# Patient Record
Sex: Male | Born: 1955
Health system: Southern US, Community
[De-identification: ages and names within clinical notes are randomized; demographics above are authoritative.]

## PROBLEM LIST (undated history)

## (undated) DIAGNOSIS — B009 Herpesviral infection, unspecified: Secondary | ICD-10-CM

## (undated) DIAGNOSIS — R7303 Prediabetes: Secondary | ICD-10-CM

## (undated) DIAGNOSIS — D369 Benign neoplasm, unspecified site: Secondary | ICD-10-CM

## (undated) DIAGNOSIS — K5730 Diverticulosis of large intestine without perforation or abscess without bleeding: Secondary | ICD-10-CM

## (undated) DIAGNOSIS — E7800 Pure hypercholesterolemia, unspecified: Secondary | ICD-10-CM

## (undated) DIAGNOSIS — K649 Unspecified hemorrhoids: Secondary | ICD-10-CM

## (undated) DIAGNOSIS — K760 Fatty (change of) liver, not elsewhere classified: Secondary | ICD-10-CM

## (undated) DIAGNOSIS — Z87442 Personal history of urinary calculi: Secondary | ICD-10-CM

## (undated) DIAGNOSIS — I10 Essential (primary) hypertension: Secondary | ICD-10-CM

## (undated) HISTORY — PX: SHOULDER SURGERY: SHX246

## (undated) HISTORY — DX: Unspecified hemorrhoids: K64.9

## (undated) HISTORY — PX: HAND SURGERY: SHX662

## (undated) HISTORY — DX: Pure hypercholesterolemia, unspecified: E78.00

## (undated) HISTORY — DX: Fatty (change of) liver, not elsewhere classified: K76.0

## (undated) HISTORY — DX: Benign neoplasm, unspecified site: D36.9

## (undated) HISTORY — PX: TONSILLECTOMY: SUR1361

## (undated) HISTORY — DX: Essential (primary) hypertension: I10

## (undated) HISTORY — DX: Diverticulosis of large intestine without perforation or abscess without bleeding: K57.30

---

## 2003-01-08 ENCOUNTER — Ambulatory Visit (HOSPITAL_COMMUNITY): Admission: RE | Admit: 2003-01-08 | Discharge: 2003-01-08 | Payer: Self-pay | Admitting: Family Medicine

## 2003-01-08 ENCOUNTER — Encounter: Payer: Self-pay | Admitting: Family Medicine

## 2004-04-07 ENCOUNTER — Ambulatory Visit (HOSPITAL_COMMUNITY): Admission: RE | Admit: 2004-04-07 | Discharge: 2004-04-07 | Payer: Self-pay | Admitting: Family Medicine

## 2004-06-03 ENCOUNTER — Emergency Department (HOSPITAL_COMMUNITY): Admission: EM | Admit: 2004-06-03 | Discharge: 2004-06-03 | Payer: Self-pay | Admitting: Emergency Medicine

## 2004-07-20 ENCOUNTER — Observation Stay (HOSPITAL_COMMUNITY): Admission: RE | Admit: 2004-07-20 | Discharge: 2004-07-21 | Payer: Self-pay | Admitting: Orthopedic Surgery

## 2006-04-24 ENCOUNTER — Ambulatory Visit (HOSPITAL_COMMUNITY): Admission: RE | Admit: 2006-04-24 | Discharge: 2006-04-24 | Payer: Self-pay | Admitting: Family Medicine

## 2006-05-29 ENCOUNTER — Ambulatory Visit (HOSPITAL_BASED_OUTPATIENT_CLINIC_OR_DEPARTMENT_OTHER): Admission: RE | Admit: 2006-05-29 | Discharge: 2006-05-30 | Payer: Self-pay | Admitting: Orthopedic Surgery

## 2007-01-10 ENCOUNTER — Ambulatory Visit (HOSPITAL_BASED_OUTPATIENT_CLINIC_OR_DEPARTMENT_OTHER): Admission: RE | Admit: 2007-01-10 | Discharge: 2007-01-11 | Payer: Self-pay | Admitting: Orthopedic Surgery

## 2007-09-04 ENCOUNTER — Ambulatory Visit: Payer: Self-pay | Admitting: Internal Medicine

## 2007-09-10 ENCOUNTER — Ambulatory Visit (HOSPITAL_COMMUNITY): Admission: RE | Admit: 2007-09-10 | Discharge: 2007-09-10 | Payer: Self-pay | Admitting: Internal Medicine

## 2007-09-10 ENCOUNTER — Ambulatory Visit: Payer: Self-pay | Admitting: Internal Medicine

## 2007-09-10 ENCOUNTER — Encounter: Payer: Self-pay | Admitting: Internal Medicine

## 2007-09-10 HISTORY — PX: COLONOSCOPY: SHX174

## 2007-09-13 ENCOUNTER — Ambulatory Visit (HOSPITAL_COMMUNITY): Admission: RE | Admit: 2007-09-13 | Discharge: 2007-09-13 | Payer: Self-pay | Admitting: Internal Medicine

## 2007-10-10 ENCOUNTER — Ambulatory Visit: Payer: Self-pay | Admitting: Internal Medicine

## 2010-09-04 ENCOUNTER — Encounter: Payer: Self-pay | Admitting: Internal Medicine

## 2010-12-27 NOTE — Consult Note (Signed)
[name] Alvarado, [name]                ACCOUNT NO.:  [id number]   MEDICAL RECORD NO.:  [id number]          PATIENT TYPE:  AMB   LOCATION:  DAY                           FACILITY:  [facility]   PHYSICIAN:  [name] Alvarado, M.D. DATE OF BIRTH:  [birth year]   DATE OF CONSULTATION:  DATE OF DISCHARGE:                                 CONSULTATION   GASTROENTEROLOGY CONSULTATION   REASON FOR CONSULTATION:  Abdominal pain, hematochezia.   PHYSICIAN REQUESTING CONSULTATION:  [name] Duel, MD.   PHYSICIAN COSIGNING NOTEJonathon Bellows, MD.   HISTORY OF PRESENT ILLNESS:  Patient is a 55 year old, Caucasian  gentleman, who presents for further evaluation of a several month  history of intermittent left lower quadrant abdominal pain and  hematochezia.  Off and on for several months, he has had fecal urgency.  He has had intermittent left lower quadrant abdominal pain, which lasts  minutes at a time.  He has seen a trace amount of bright red blood per  rectum.  Generally his stools are very regular and usually has  postprandial stools two or three times a day.  Denies any melena.  Denies any nausea or vomiting, heartburn, dysphagia, or odynophagia.  No  weight loss.  He has actually gained about 40 pounds in the last year or  two.  He states he has been under a lot of stress.  His girlfriend was  diagnosed with pancreatic cancer about eight or nine months ago at the  age of 46.  She is receiving palliative care only.  He has never  underwent a colonoscopy.  Denies any family history of colorectal cancer  or liver disease or chronic GI illnesses.   CURRENT MEDICATIONS:  1. Lisinopril 10 mg daily.  2. Flexeril p.Iann.  3. Omega-3 and Fish Oil daily.   ALLERGIES:  No known drug allergies.   PAST MEDICAL HISTORY:  1. Hypertension.  2. Hypercholesterolemia, he does not take any statins because he does      not like the risk for potential side effects.  3. History of sinusitis and headaches.  4.  He has multiple scars on his neck related to prior injuries.  5. Bilateral shoulder repair, twice on the right in the last three      years.  6. Left hand surgery.  7. Tonsillectomy.   FAMILY HISTORY:  Negative for GI illnesses, colorectal cancer, or liver  disease.   SOCIAL HISTORY:  He is divorced.  He has two children, a son and a  daughter.  His girlfriend is age 48 and has terminal pancreatic  carcinoma.  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  He has  never been a smoker.  Denies any regular alcohol use.  As a teenager, he  would drink on the weekends only.   REVIEW OF SYSTEMS:  See HPI for GI and CONSTITUTIONAL.  CARDIOPULMONARY:  He denies any chest pain or shortness of breath.  GENITOURINARY:  No  dysuria or hematuria.   PHYSICAL EXAMINATION:  VITAL  SIGNS:  Weight 225, height 5 foot 11, temp  97.6, blood pressure 142/96, pulse 68.  GENERAL:  A pleasant, well-nourished, well-developed, Caucasian  gentleman in no acute distress.  SKIN:  Warm and dry, no jaundice.  HEENT:  Sclerae are nonicteric, oropharyngeal mucosa moist and pink, no  lesions, erythema, or exudate.  NECK:  No lymphadenopathy or thyromegaly.  CHEST:  Lungs are clear to auscultation.  CARDIAC:  Regular rate and rhythm, normal S1 S2, no murmurs, rubs, or  gallops.  ABDOMEN:  Positive bowel sounds, abdomen is soft and nondistended, he  has mild left lower quadrant tenderness to deep palpation, no rebound  tenderness or guarding, no abdominal bruits, no hernias.  Liver edge  palpable 4 to 5 cm below the right costal margin in the midclavicular  line.  LOWER EXTREMITIES:  No edema.   IMPRESSION:  [name] Alvarado is a 55 year old gentleman with intermittent left  lower quadrant abdominal pain and intermittent hematochezia.  He has  never had a colonoscopy.  We will recommend colonoscopy for further  evaluation.  Differential  includes bleeding from internal hemorrhoids,  polyps, colorectal cancer, less likely inflammatory bowel disease.  He  also has a history of mildly elevated transaminases possibly in the  setting of statins for hypercholesterolemia.  Recent labs, however, on  August 29, 2007, revealed normal liver function tests.   PLAN:  1. Colonoscopy in the near future with [name] Alvarado.  2. If colonoscopy does not explain his abdominal pain, I would      consider further evaluation at that time.  3. Further recommendations to follow.   I would like to thank [name] Alvarado for allowing [place] to take part in the  care of this patient.      [name] Alvarado, P.AJonathon Alvarado, M.D.  Electronically Signed    [name]  D:  09/04/2007  T:  09/04/2007  Job:  [id number]   cc:   [name] Alvarado, M.D.  Fax: [phone]

## 2010-12-27 NOTE — Assessment & Plan Note (Signed)
NAMEBLISS, TSANG                 CHART#:  [id number]   DATE:  10/10/2007                       DOB:  Nov 14, 1955   CHIEF COMPLAINT:  Follow up of procedure.   SUBJECTIVE:  Mr. Almquist is here for a follow up. He was recently seen at  the time of colonoscopy on 09/10/2007 to further evaluate intermittent  left lower quadrant abdominal pain and interim intense hematochezia. He  had friable internal and anal hemorrhoids. He had a sigmoid diverticula  and diminutive polyp at 30 centimeters. The polyp was a tubular adenoma.  He is due for surveillance colonoscopy in January 2014. He also had a CT  to further evaluate his abdominal pain. This revealed fatty liver. There  was no obvious cause for his abdominal pain. He also had normal LFTs and  CBC. He states that overall he is feeling better. He is having only  vague left lower quadrant abdominal pain at times now. He denies any  change in appetite. There is no heartburn. Bowel movements are regular.  He has had no further bleeding.   CURRENT MEDICATIONS:  See the updated list.   ALLERGIES:  No known drug allergies.   PHYSICAL EXAMINATION:  VITAL SIGNS:  Weight 222, temperature 98, blood  pressure 130/88, pulse 88.  GENERAL:  Pleasant, well developed, well nourished Caucasian gentleman  in no acute distress.  SKIN:  Warm and dry. No jaundice.  HEENT:  Sclerae nonicteric, oral pharyngeal mucosa moist and pink.  ABDOMEN:  Positive bowel sounds. Soft and nontender. Nondistended. No  organomegaly or masses. No rebound or guarding. No abdominal bruits or  hernia.  LOWER EXTREMITIES:  No edema.   IMPRESSION:  Mr. Sidman is a pleasant 55 year old gentleman with vague  left lower quadrant abdominal pain and intermittent hematochezia.  Colonoscopy revealed hemorrhoids and a tubular adenoma which was  removed. CT, I felt did not reveal any cause of his abdominal pain. He  had fatty liver in the setting of normal LFTs. Overall, he is feeling  better. He admits that he has been under quite a bit of stress with his  girlfriend who has terminal pancreatic cancer. He states that his pain  has improved and is very minimal at this point. I offered anti-spasmodic  therapy with a trial. However, he declines.   PLAN:  1. He will be due for colonoscopy given his history of tubular adenoma      in January 2014.  2. Office visit p.r.n.       [name], P.A.  Electronically Signed     R. Roetta Sessions, M.D.  Electronically Signed    [name]  D:  10/10/2007  T:  10/11/2007  Job:  [id number]   cc:   Patrica Duel, M.D.

## 2010-12-27 NOTE — Op Note (Signed)
[name] Alvarado, [name] Alvarado                ACCOUNT NO.:  [id number]   MEDICAL RECORD NO.:  [id number]          PATIENT TYPE:  AMB   LOCATION:  DAY                           FACILITY:  [facility]   PHYSICIAN:  R. Roetta Sessions, M.D. DATE OF BIRTH:  08-Apr-1956   DATE OF PROCEDURE:  09/10/2007  DATE OF DISCHARGE:                               OPERATIVE REPORT   PROCEDURE:  Colonoscopy with biopsy.   INDICATIONS FOR PROCEDURE:  A 55 year old gentleman with intermittent  left lower quadrant abdominal pain intermittent with hematochezia.  He  has never had his lower GI tract imaged, and there is no family history  of colorectal neoplasia or inflammatory bowel disease.  Colonoscopy is  now being done.  This procedure was discussed with the patient at  length, the potential risks, benefits, and alternatives have been  reviewed, and questions answered.  He is agreeable.  Please see the  documentation in the medical record.   PROCEDURE NOTE:  O2 saturation, blood pressure, pulse, and respirations  were monitored throughout the entire procedure.  Conscious sedation with  Versed 4 mg IV and Demerol 100 mg IV in divided doses.   INSTRUMENT:  Pentax video chip system.   FINDINGS:  Digital rectal exam revealed no abnormalities.  Endoscopic  findings:  The prep was adequate.  The colon and colonic mucosa were  surveyed from the rectosigmoid junction to the left transverse and right  colon to the appendiceal orifices, ileocecal valve, and cecum where  these structures were well-seen and photographed for the record.  Following this, the colonoscope was slowly withdrawn and all previous  mucosal surfaces were again seen.  The patient was noted to have the  following abnormalities:  1)  Scattered sigmoid diverticula; 2)  Diminutive polyp at 30 cm, cold biopsied/removed.  The scope was pulled  down into the rectum.  Upon further examination of the rectal mucosa  including a retroflexed view of the anal verge and  retroflexed view of  the anal canal, there were some generally some friable internal  hemorrhoids.  The patient tolerated the procedure well and was reacted  in Endoscopy.   IMPRESSION:  1. Friable hemorrhoids (internal and anal canal), otherwise, normal      rectum.  2. Sigmoid diverticula and diminutive polyp at 30 cm status post cold      biopsy and removal.  The remainder of the colonic mucosa appeared      normal.   RECOMMENDATIONS:  Follow up on path.  Diverticulosis literature provided  to [name] Alvarado.  I suspect he bled from hemorrhoids, but it does not  explain his abdominal pain.  Will proceed with an abdominopelvic CT scan  in the very near future and make further recommendations soon.      [name] Alvarado, M.D.  Electronically Signed     RMR/MEDQ  D:  09/10/2007  T:  09/10/2007  Job:  [id number]   cc:   Patrica Duel, M.D.  Fax: [phone]

## 2012-07-24 ENCOUNTER — Encounter: Payer: Self-pay | Admitting: *Deleted

## 2012-08-27 ENCOUNTER — Telehealth: Payer: Self-pay | Admitting: *Deleted

## 2012-08-27 NOTE — Telephone Encounter (Signed)
I attempted to call [name] Alvarado today to speak with him about his colonoscopy which is due this month, however there was no answer.

## 2013-04-30 ENCOUNTER — Other Ambulatory Visit: Payer: Self-pay | Admitting: Internal Medicine

## 2013-04-30 ENCOUNTER — Ambulatory Visit (INDEPENDENT_AMBULATORY_CARE_PROVIDER_SITE_OTHER): Payer: 59 | Admitting: Internal Medicine

## 2013-04-30 ENCOUNTER — Encounter: Payer: Self-pay | Admitting: Internal Medicine

## 2013-04-30 VITALS — BP 149/89 | HR 69 | Temp 97.2°F | Ht 71.0 in | Wt 216.4 lb

## 2013-04-30 DIAGNOSIS — Z860100 Personal history of colon polyps, unspecified: Secondary | ICD-10-CM | POA: Insufficient documentation

## 2013-04-30 DIAGNOSIS — Z1211 Encounter for screening for malignant neoplasm of colon: Secondary | ICD-10-CM

## 2013-04-30 MED ORDER — PEG-KCL-NACL-NASULF-NA ASC-C 100 G PO SOLR: 1.0000 | ORAL | Status: DC

## 2013-04-30 NOTE — Progress Notes (Signed)
Primary Care Physician:  Kirk Ruths, MD Primary Gastroenterologist:  Dr. Jena Gauss  Pre-Procedure History & Physical: HPI:  [name] Alvarado is a 57 y.o. male here for here for consideration of surveillance colonoscopy. In early 2009, patient had a couple of adenomatous polyps removed his colon. He has done well. No rectal bleeding or abdominal pain. In fact, no GI symptoms. He is due for a surveillance examination at this time.  Past Medical History  Diagnosis Date  . HTN (hypertension)   . Hypercholesterolemia   . Tubular adenoma   . Diverticula of colon   . Hemorrhoids   . Fatty liver     Past Surgical History  Procedure Laterality Date  . Shoulder surgery      bilat  . Hand surgery      L  . Tonsillectomy    . Colonoscopy  09/10/2007    Dr. Jena Gauss- friable himorrhoids (internal and anal canal) o/w normal rectum,sigmoid diverticula, bx= tubular adenoma    Prior to Admission medications   Medication Sig Start Date End Date Taking? Authorizing Provider  lisinopril (PRINIVIL,ZESTRIL) 20 MG tablet Take 20 mg by mouth daily.  04/07/13  Yes Historical Provider, MD  peg 3350 powder (MOVIPREP) 100 G SOLR Take 1 kit (200 g total) by mouth as directed. 04/30/13   Corbin Ade, MD    Allergies as of 04/30/2013  . (Not on File)    No family history on file.  History   Social History  . Marital Status: Married    Spouse Name: N/A    Number of Children: N/A  . Years of Education: N/A   Occupational History  . Not on file.   Social History Main Topics  . Smoking status: Never Smoker   . Smokeless tobacco: Not on file  . Alcohol Use: No  . Drug Use: No  . Sexual Activity: Not on file   Other Topics Concern  . Not on file   Social History Narrative  . No narrative on file    Review of Systems: See HPI, otherwise negative ROS  Physical Exam: BP 149/89  Pulse 69  Temp(Src) 97.2 F (36.2 C) (Oral)  Ht 5\' 11"  (1.803 m)  Wt 216 lb 6.4 oz (98.158 kg)  BMI 30.19  kg/m2 General:   Alert,  Well-developed, well-nourished, pleasant and cooperative in NAD Skin:  Intact without significant lesions or rashes. Eyes:  Sclera clear, no icterus.   Conjunctiva pink. Ears:  Normal auditory acuity. Nose:  No deformity, discharge,  or lesions. Mouth:  No deformity or lesions. Neck:  Supple; no masses or thyromegaly. No significant cervical adenopathy. Lungs:  Clear throughout to auscultation.   No wheezes, crackles, or rhonchi. No acute distress. Heart:  Regular rate and rhythm; no murmurs, clicks, rubs,  or gallops. Abdomen: Non-distended, normal bowel sounds.  Soft and nontender without appreciable mass or hepatosplenomegaly.  Pulses:  Normal pulses noted. Extremities:  Without clubbing or edema.  Impression/Plan:  Pleasant 58 year old gentleman who had 2 adenomatous polyps removed from his colon about 5 and half years ago. Surveillance colonoscopy offered to this nice gentleman at this time. The risks, benefits, limitations, alternatives and imponderables have been reviewed with the patient. Questions have been answered. All parties are agreeable.

## 2013-04-30 NOTE — Patient Instructions (Signed)
Schedule surveillance colonoscopy (history of colonic adenomas)  Use split prep

## 2013-05-09 ENCOUNTER — Encounter (HOSPITAL_COMMUNITY): Payer: Self-pay | Admitting: Pharmacy Technician

## 2013-05-16 ENCOUNTER — Encounter (HOSPITAL_COMMUNITY)
Admission: RE | Disposition: A | Discharge status: Home / Self Care | Payer: Self-pay | Source: Ambulatory Visit | Attending: Internal Medicine

## 2013-05-16 ENCOUNTER — Ambulatory Visit (HOSPITAL_COMMUNITY)
Admission: RE | Admit: 2013-05-16 | Discharge: 2013-05-16 | Disposition: A | Discharge status: Home / Self Care | Payer: 59 | Source: Ambulatory Visit | Attending: Internal Medicine | Admitting: Internal Medicine

## 2013-05-16 ENCOUNTER — Encounter (HOSPITAL_COMMUNITY): Payer: Self-pay

## 2013-05-16 DIAGNOSIS — Z860100 Personal history of colon polyps, unspecified: Secondary | ICD-10-CM | POA: Insufficient documentation

## 2013-05-16 DIAGNOSIS — Z1211 Encounter for screening for malignant neoplasm of colon: Secondary | ICD-10-CM

## 2013-05-16 DIAGNOSIS — K5730 Diverticulosis of large intestine without perforation or abscess without bleeding: Secondary | ICD-10-CM

## 2013-05-16 DIAGNOSIS — D175 Benign lipomatous neoplasm of intra-abdominal organs: Secondary | ICD-10-CM

## 2013-05-16 DIAGNOSIS — I10 Essential (primary) hypertension: Secondary | ICD-10-CM | POA: Insufficient documentation

## 2013-05-16 DIAGNOSIS — Z09 Encounter for follow-up examination after completed treatment for conditions other than malignant neoplasm: Secondary | ICD-10-CM | POA: Insufficient documentation

## 2013-05-16 DIAGNOSIS — D126 Benign neoplasm of colon, unspecified: Secondary | ICD-10-CM | POA: Insufficient documentation

## 2013-05-16 HISTORY — PX: COLONOSCOPY: SHX5424

## 2013-05-16 SURGERY — COLONOSCOPY
Anesthesia: Moderate Sedation

## 2013-05-16 MED ORDER — SODIUM CHLORIDE 0.9 % IV SOLN
INTRAVENOUS | Status: DC
  Administered 2013-05-16: 07:00:00 via INTRAVENOUS

## 2013-05-16 MED ORDER — MEPERIDINE HCL 100 MG/ML IJ SOLN
INTRAMUSCULAR | Status: AC
  Filled 2013-05-16: qty 2

## 2013-05-16 MED ORDER — MEPERIDINE HCL 25 MG/ML IJ SOLN
INTRAMUSCULAR | Status: DC | PRN
  Administered 2013-05-16: 25 mg via INTRAVENOUS

## 2013-05-16 MED ORDER — ONDANSETRON HCL 4 MG/2ML IJ SOLN
INTRAMUSCULAR | Status: AC
  Filled 2013-05-16: qty 2

## 2013-05-16 MED ORDER — MEPERIDINE HCL 100 MG/ML IJ SOLN
INTRAMUSCULAR | Status: DC | PRN
  Administered 2013-05-16 (×2): 50 mg via INTRAVENOUS

## 2013-05-16 MED ORDER — MIDAZOLAM HCL 5 MG/5ML IJ SOLN
INTRAMUSCULAR | Status: AC
  Filled 2013-05-16: qty 10

## 2013-05-16 MED ORDER — MIDAZOLAM HCL 5 MG/5ML IJ SOLN
INTRAMUSCULAR | Status: DC | PRN
  Administered 2013-05-16: 1 mg via INTRAVENOUS

## 2013-05-16 MED ORDER — STERILE WATER FOR IRRIGATION IR SOLN
Status: DC | PRN
  Administered 2013-05-16: 07:00:00

## 2013-05-16 MED ORDER — MIDAZOLAM HCL 5 MG/5ML IJ SOLN
INTRAMUSCULAR | Status: DC | PRN
  Administered 2013-05-16 (×2): 2 mg via INTRAVENOUS

## 2013-05-16 NOTE — Op Note (Signed)
[facility] [address] [place], [address]   COLONOSCOPY PROCEDURE REPORT  PATIENT: [name], Alvarado  MR#:         [id number] BIRTHDATE: 27-Mar-1956 , 57  yrs. old GENDER: Male ENDOSCOPIST: R.  Roetta Sessions, MD FACP FACG REFERRED BY:  Karleen Hampshire, M.D. PROCEDURE DATE:  05/16/2013 PROCEDURE:     Ileocolonoscopy with snare polypectomy and biopsy  INDICATIONS: history of colonic adenoma  INFORMED CONSENT:  The risks, benefits, alternatives and imponderables including but not limited to bleeding, perforation as well as the possibility of a missed lesion have been reviewed.  The potential for biopsy, lesion removal, etc. have also been discussed.  Questions have been answered.  All parties agreeable. Please see the history and physical in the medical record for more information.  MEDICATIONS: Versed 5 mg IV and Demerol 125 mg IV in divided doses. Zofran 4 mg IV.  DESCRIPTION OF PROCEDURE:  After a digital rectal exam was performed, the EC-[id number] ([id number])  colonoscope was advanced from the anus through the rectum and colon to the area of the cecum, ileocecal valve and appendiceal orifice.  The cecum was deeply intubated.  These structures were well-seen and photographed for the record.  From the level of the cecum and ileocecal valve, the scope was slowly and cautiously withdrawn.  The mucosal surfaces were carefully surveyed utilizing scope tip deflection to facilitate fold flattening as needed.  The scope was pulled down into the rectum where a thorough examination including retroflexion was performed.    FINDINGS:  Adequate preparation. Normal rectum. Scattered pancolonic diverticula. (1) mid sigmoid diminutive polyp. (1) 6 mm mid descending colon polyp. Otherwise, the remainder of the colonic mucosa appeared normal aside from a 6 mm yellowish submucosal nodule at the hepatic flexure (positive + pillow sign).  The distal 5 cm of terminal ileal mucosa  appeared normal.  THERAPEUTIC / DIAGNOSTIC MANEUVERS PERFORMED:  The above-mentioned polyps were cold biopsy and cold snare removed, respectively  COMPLICATIONS: None CECAL WITHDRAWAL TIME:  13 minutes  IMPRESSION:  Colonic polyps-removed as described above. Colonic diverticulosis. Colonic lipoma.  RECOMMENDATIONS: Followup on pathology report.   _______________________________ eSigned:  R. Roetta Sessions, MD FACP [facility] 05/16/2013 8:34 AM   CC:    PATIENT NAME:  [name], Alvarado MR#: [id number]

## 2013-05-16 NOTE — Interval H&P Note (Signed)
History and Physical Interval Note:  05/16/2013 7:36 AM  [name] Alvarado  has presented today for surgery, with the diagnosis of HISTORY OF COLONIC ADENOMA  The various methods of treatment have been discussed with the patient and family. After consideration of risks, benefits and other options for treatment, the patient has consented to  Procedure(s) with comments: COLONOSCOPY (N/A) - 7:30 as a surgical intervention .  The patient's history has been reviewed, patient examined, no change in status, stable for surgery.  I have reviewed the patient's chart and labs.  Questions were answered to the patient's satisfaction.     No change;  colonoscopy per plan.The risks, benefits, limitations, alternatives and imponderables have been reviewed with the patient. Questions have been answered. All parties are agreeable.   Eula Listen

## 2013-05-16 NOTE — H&P (View-Only) (Signed)
Primary Care Physician:  Kirk Ruths, MD Primary Gastroenterologist:  Dr. Jena Gauss  Pre-Procedure History & Physical: HPI:  [name] Alvarado is a 57 y.o. male here for here for consideration of surveillance colonoscopy. In early 2009, patient had a couple of adenomatous polyps removed his colon. He has done well. No rectal bleeding or abdominal pain. In fact, no GI symptoms. He is due for a surveillance examination at this time.  Past Medical History  Diagnosis Date  . HTN (hypertension)   . Hypercholesterolemia   . Tubular adenoma   . Diverticula of colon   . Hemorrhoids   . Fatty liver     Past Surgical History  Procedure Laterality Date  . Shoulder surgery      bilat  . Hand surgery      L  . Tonsillectomy    . Colonoscopy  09/10/2007    Dr. Jena Gauss- friable himorrhoids (internal and anal canal) o/w normal rectum,sigmoid diverticula, bx= tubular adenoma    Prior to Admission medications   Medication Sig Start Date End Date Taking? Authorizing Provider  lisinopril (PRINIVIL,ZESTRIL) 20 MG tablet Take 20 mg by mouth daily.  04/07/13  Yes Historical Provider, MD  peg 3350 powder (MOVIPREP) 100 G SOLR Take 1 kit (200 g total) by mouth as directed. 04/30/13   Corbin Ade, MD    Allergies as of 04/30/2013  . (Not on File)    No family history on file.  History   Social History  . Marital Status: Married    Spouse Name: N/A    Number of Children: N/A  . Years of Education: N/A   Occupational History  . Not on file.   Social History Main Topics  . Smoking status: Never Smoker   . Smokeless tobacco: Not on file  . Alcohol Use: No  . Drug Use: No  . Sexual Activity: Not on file   Other Topics Concern  . Not on file   Social History Narrative  . No narrative on file    Review of Systems: See HPI, otherwise negative ROS  Physical Exam: BP 149/89  Pulse 69  Temp(Src) 97.2 F (36.2 C) (Oral)  Ht 5\' 11"  (1.803 m)  Wt 216 lb 6.4 oz (98.158 kg)  BMI 30.19  kg/m2 General:   Alert,  Well-developed, well-nourished, pleasant and cooperative in NAD Skin:  Intact without significant lesions or rashes. Eyes:  Sclera clear, no icterus.   Conjunctiva pink. Ears:  Normal auditory acuity. Nose:  No deformity, discharge,  or lesions. Mouth:  No deformity or lesions. Neck:  Supple; no masses or thyromegaly. No significant cervical adenopathy. Lungs:  Clear throughout to auscultation.   No wheezes, crackles, or rhonchi. No acute distress. Heart:  Regular rate and rhythm; no murmurs, clicks, rubs,  or gallops. Abdomen: Non-distended, normal bowel sounds.  Soft and nontender without appreciable mass or hepatosplenomegaly.  Pulses:  Normal pulses noted. Extremities:  Without clubbing or edema.  Impression/Plan:  Pleasant 57 year old gentleman who had 2 adenomatous polyps removed from his colon about 5 and half years ago. Surveillance colonoscopy offered to this nice gentleman at this time. The risks, benefits, limitations, alternatives and imponderables have been reviewed with the patient. Questions have been answered. All parties are agreeable.

## 2013-05-19 ENCOUNTER — Encounter: Payer: Self-pay | Admitting: Internal Medicine

## 2013-05-20 ENCOUNTER — Encounter (HOSPITAL_COMMUNITY): Payer: Self-pay | Admitting: Internal Medicine

## 2015-10-18 ENCOUNTER — Emergency Department (HOSPITAL_COMMUNITY)
Admission: EM | Admit: 2015-10-18 | Discharge: 2015-10-18 | Disposition: A | Discharge status: Home / Self Care | Payer: Self-pay | Attending: Emergency Medicine | Admitting: Emergency Medicine

## 2015-10-18 ENCOUNTER — Encounter (HOSPITAL_COMMUNITY): Payer: Self-pay | Admitting: Emergency Medicine

## 2015-10-18 ENCOUNTER — Emergency Department (HOSPITAL_COMMUNITY): Payer: Self-pay

## 2015-10-18 DIAGNOSIS — J40 Bronchitis, not specified as acute or chronic: Secondary | ICD-10-CM | POA: Insufficient documentation

## 2015-10-18 DIAGNOSIS — E7800 Pure hypercholesterolemia, unspecified: Secondary | ICD-10-CM | POA: Insufficient documentation

## 2015-10-18 DIAGNOSIS — Z79899 Other long term (current) drug therapy: Secondary | ICD-10-CM | POA: Insufficient documentation

## 2015-10-18 DIAGNOSIS — I10 Essential (primary) hypertension: Secondary | ICD-10-CM | POA: Insufficient documentation

## 2015-10-18 DIAGNOSIS — R55 Syncope and collapse: Secondary | ICD-10-CM | POA: Insufficient documentation

## 2015-10-18 LAB — CBC WITH DIFFERENTIAL/PLATELET
Basophils Absolute: 0 10*3/uL (ref 0.0–0.1)
Basophils Relative: 0 %
Eosinophils Absolute: 0.1 10*3/uL (ref 0.0–0.7)
Eosinophils Relative: 2 %
HCT: 43.1 % (ref 39.0–52.0)
Hemoglobin: 14.2 g/dL (ref 13.0–17.0)
Lymphocytes Relative: 17 %
Lymphs Abs: 1.3 10*3/uL (ref 0.7–4.0)
MCH: 29.9 pg (ref 26.0–34.0)
MCHC: 32.9 g/dL (ref 30.0–36.0)
MCV: 90.7 fL (ref 78.0–100.0)
Monocytes Absolute: 0.5 10*3/uL (ref 0.1–1.0)
Monocytes Relative: 7 %
Neutro Abs: 5.5 10*3/uL (ref 1.7–7.7)
Neutrophils Relative %: 74 %
Platelets: 122 10*3/uL — ABNORMAL LOW (ref 150–400)
RBC: 4.75 MIL/uL (ref 4.22–5.81)
RDW: 13.2 % (ref 11.5–15.5)
WBC: 7.5 10*3/uL (ref 4.0–10.5)

## 2015-10-18 LAB — BASIC METABOLIC PANEL
Anion gap: 7 (ref 5–15)
BUN: 17 mg/dL (ref 6–20)
CO2: 27 mmol/L (ref 22–32)
Calcium: 8 mg/dL — ABNORMAL LOW (ref 8.9–10.3)
Chloride: 103 mmol/L (ref 101–111)
Creatinine, Ser: 0.93 mg/dL (ref 0.61–1.24)
GFR calc Af Amer: >60 mL/min (ref >60)
GFR calc non Af Amer: >60 mL/min (ref >60)
Glucose, Bld: 126 mg/dL — ABNORMAL HIGH (ref 65–99)
Potassium: 4.3 mmol/L (ref 3.5–5.1)
Sodium: 137 mmol/L (ref 135–145)

## 2015-10-18 MED ORDER — DEXAMETHASONE SODIUM PHOSPHATE 4 MG/ML IJ SOLN
12.0000 mg | Freq: Once | INTRAMUSCULAR | Status: AC
  Administered 2015-10-18: 12 mg via INTRAVENOUS
  Filled 2015-10-18: qty 3

## 2015-10-18 MED ORDER — SODIUM CHLORIDE 0.9 % IV BOLUS (SEPSIS)
1000.0000 mL | Freq: Once | INTRAVENOUS | Status: AC
  Administered 2015-10-18: 1000 mL via INTRAVENOUS

## 2015-10-18 NOTE — ED Notes (Signed)
Per EMS- pt reports feeling "lightheaded and weak" while working today and "waking up" in supervisor's office. Unsure of loc or fall-not reported by coworkers. Pt reports not feeling well x a few days. Denies n/v/d. EMS reports pt was alert and oriented upon their arrival, bradycardic and hypotensive. Pt given 500 cc fluid bolus in route. Nad noted.

## 2015-10-21 NOTE — ED Provider Notes (Signed)
CSN: [id number]     Arrival date & time 10/18/15  0726 History   First MD Initiated Contact with Patient 10/18/15 0732     Chief Complaint  Patient presents with  . Weakness     (Consider location/radiation/quality/duration/timing/severity/associated sxs/prior Treatment) HPI   59yM with syncope. Was at work at [employer] when began to feel nauseated and sweaty. He felt as if he may pass out and started walking over to his supervisor's office. Reportedly he did then pass out briefly. Brief loss of consciousness. Quick return to baseline. Currently feels much better On review of systems she reports that over the past several days she has not felt very well. Has been coughing and wheezing. Has felt tired/run down.He denies any fevers or chills. No acute pain. Unusual leg pain or swelling. No vomiting or diarrhea.Is a smoker. He was appropriately bradycardic and hypotensive while on scene. He received 500 mL normal saline prior to arrival. Arrived to the emergency room in no distress with a normal heart rate and blood pressure.  Past Medical History  Diagnosis Date  . HTN (hypertension)   . Hypercholesterolemia   . Tubular adenoma   . Diverticula of colon   . Hemorrhoids   . Fatty liver    Past Surgical History  Procedure Laterality Date  . Shoulder surgery      bilat  . Hand surgery      L  . Tonsillectomy    . Colonoscopy  09/10/2007    Dr. Gala Romney- friable himorrhoids (internal and anal canal) o/w normal rectum,sigmoid diverticula, bx= tubular adenoma  . Colonoscopy N/A 05/16/2013    Procedure: COLONOSCOPY;  Surgeon: Daneil Dolin, MD;  Location: [facility];  Service: Endoscopy;  Laterality: N/A;  7:30   No family history on file. Social History  Substance Use Topics  . Smoking status: Never Smoker   . Smokeless tobacco: None  . Alcohol Use: No    Review of Systems  All systems reviewed and negative, other than as noted in HPI.   Allergies  Review of patient's allergies  indicates no known allergies.  Home Medications   Prior to Admission medications   Medication Sig Start Date End Date Taking? Authorizing Provider  lisinopril (PRINIVIL,ZESTRIL) 20 MG tablet Take 20 mg by mouth daily.  04/07/13   Historical Provider, MD  loratadine (CLARITIN) 10 MG tablet Take 10 mg by mouth daily as needed for allergies.    Historical Provider, MD  peg 3350 powder (MOVIPREP) 100 G SOLR Take 1 kit (200 g total) by mouth as directed. 04/30/13   Daneil Dolin, MD   BP 135/74 mmHg  Pulse 75  Temp(Src) 97.9 F (36.6 C) (Oral)  Resp 14  Ht '5\' 11"'  (1.803 m)  Wt 206 lb (93.441 kg)  BMI 28.74 kg/m2  SpO2 98% Physical Exam  Constitutional: He appears well-developed and well-nourished. No distress.  HENT:  Head: Normocephalic and atraumatic.  Eyes: Conjunctivae are normal. Right eye exhibits no discharge. Left eye exhibits no discharge.  Neck: Neck supple.  Cardiovascular: Normal rate, regular rhythm and normal heart sounds.  Exam reveals no gallop and no friction rub.   No murmur heard. Pulmonary/Chest: Effort normal. No respiratory distress. He has wheezes.  Abdominal: Soft. He exhibits no distension. There is no tenderness.  Musculoskeletal: He exhibits no edema or tenderness.  Lower extremities symmetric as compared to each other. No calf tenderness. Negative Homan's. No palpable cords.   Neurological: He is alert.  Skin: Skin  is warm and dry.  Psychiatric: He has a normal mood and affect. His behavior is normal. Thought content normal.  Nursing note and vitals reviewed.   ED Course  Procedures (including critical care time) Labs Review Labs Reviewed  CBC WITH DIFFERENTIAL/PLATELET - Abnormal; Notable for the following:    Platelets 122 (*)    All other components within normal limits  BASIC METABOLIC PANEL - Abnormal; Notable for the following:    Glucose, Bld 126 (*)    Calcium 8.0 (*)    All other components within normal limits    Imaging Review No  results found. I have personally reviewed and evaluated these images and lab results as part of my medical decision-making.   EKG Interpretation   Date/Time:  Monday October 18 2015 07:26:54 EST Ventricular Rate:  63 PR Interval:  159 QRS Duration: 83 QT Interval:  404 QTC Calculation: 413 R Axis:   46 Text Interpretation:  Sinus rhythm Nonspecific T abnormalities, lateral  leads No old tracing to compare Confirmed by Yanci Bachtell  MD, [place] ([id number]) on  10/18/2015 7:42:12 AM      MDM   Final diagnoses:  Syncope and collapse  Bronchitis    60 year old male with syncopal episode today. Hemodynamically stable. Now feels much better. Recent URI symptoms. Diffuse wheezing on exam. Suspect viral bronchitis.    Virgel Manifold, MD 10/22/15 [phone]

## 2016-06-08 ENCOUNTER — Ambulatory Visit (INDEPENDENT_AMBULATORY_CARE_PROVIDER_SITE_OTHER): Payer: BLUE CROSS/BLUE SHIELD | Admitting: *Deleted

## 2016-06-08 DIAGNOSIS — Z23 Encounter for immunization: Secondary | ICD-10-CM | POA: Diagnosis not present

## 2016-07-10 DIAGNOSIS — E663 Overweight: Secondary | ICD-10-CM | POA: Diagnosis not present

## 2016-07-10 DIAGNOSIS — I10 Essential (primary) hypertension: Secondary | ICD-10-CM | POA: Diagnosis not present

## 2016-07-10 DIAGNOSIS — E669 Obesity, unspecified: Secondary | ICD-10-CM | POA: Diagnosis not present

## 2016-07-10 DIAGNOSIS — Z1389 Encounter for screening for other disorder: Secondary | ICD-10-CM | POA: Diagnosis not present

## 2016-07-10 DIAGNOSIS — Z6828 Body mass index (BMI) 28.0-28.9, adult: Secondary | ICD-10-CM | POA: Diagnosis not present

## 2016-09-20 DIAGNOSIS — Z6827 Body mass index (BMI) 27.0-27.9, adult: Secondary | ICD-10-CM | POA: Diagnosis not present

## 2016-09-20 DIAGNOSIS — B349 Viral infection, unspecified: Secondary | ICD-10-CM | POA: Diagnosis not present

## 2016-09-20 DIAGNOSIS — J019 Acute sinusitis, unspecified: Secondary | ICD-10-CM | POA: Diagnosis not present

## 2016-09-20 DIAGNOSIS — E663 Overweight: Secondary | ICD-10-CM | POA: Diagnosis not present

## 2016-09-20 DIAGNOSIS — Z1389 Encounter for screening for other disorder: Secondary | ICD-10-CM | POA: Diagnosis not present

## 2016-12-15 DIAGNOSIS — Z1389 Encounter for screening for other disorder: Secondary | ICD-10-CM | POA: Diagnosis not present

## 2016-12-15 DIAGNOSIS — W57XXXA Bitten or stung by nonvenomous insect and other nonvenomous arthropods, initial encounter: Secondary | ICD-10-CM | POA: Diagnosis not present

## 2016-12-15 DIAGNOSIS — I10 Essential (primary) hypertension: Secondary | ICD-10-CM | POA: Diagnosis not present

## 2016-12-15 DIAGNOSIS — Z6827 Body mass index (BMI) 27.0-27.9, adult: Secondary | ICD-10-CM | POA: Diagnosis not present

## 2016-12-15 DIAGNOSIS — E663 Overweight: Secondary | ICD-10-CM | POA: Diagnosis not present

## 2017-05-18 DIAGNOSIS — Z1389 Encounter for screening for other disorder: Secondary | ICD-10-CM | POA: Diagnosis not present

## 2017-05-18 DIAGNOSIS — Z6827 Body mass index (BMI) 27.0-27.9, adult: Secondary | ICD-10-CM | POA: Diagnosis not present

## 2017-05-18 DIAGNOSIS — I10 Essential (primary) hypertension: Secondary | ICD-10-CM | POA: Diagnosis not present

## 2017-05-18 DIAGNOSIS — E663 Overweight: Secondary | ICD-10-CM | POA: Diagnosis not present

## 2017-05-18 DIAGNOSIS — Z23 Encounter for immunization: Secondary | ICD-10-CM | POA: Diagnosis not present

## 2017-11-16 DIAGNOSIS — Z6826 Body mass index (BMI) 26.0-26.9, adult: Secondary | ICD-10-CM | POA: Diagnosis not present

## 2017-11-16 DIAGNOSIS — E663 Overweight: Secondary | ICD-10-CM | POA: Diagnosis not present

## 2017-11-16 DIAGNOSIS — R7309 Other abnormal glucose: Secondary | ICD-10-CM | POA: Diagnosis not present

## 2017-11-16 DIAGNOSIS — I10 Essential (primary) hypertension: Secondary | ICD-10-CM | POA: Diagnosis not present

## 2017-11-16 DIAGNOSIS — Z1389 Encounter for screening for other disorder: Secondary | ICD-10-CM | POA: Diagnosis not present

## 2017-11-16 DIAGNOSIS — M546 Pain in thoracic spine: Secondary | ICD-10-CM | POA: Diagnosis not present

## 2018-02-08 DIAGNOSIS — I10 Essential (primary) hypertension: Secondary | ICD-10-CM | POA: Diagnosis not present

## 2018-02-08 DIAGNOSIS — Z0001 Encounter for general adult medical examination with abnormal findings: Secondary | ICD-10-CM | POA: Diagnosis not present

## 2018-02-08 DIAGNOSIS — Z6825 Body mass index (BMI) 25.0-25.9, adult: Secondary | ICD-10-CM | POA: Diagnosis not present

## 2018-02-08 DIAGNOSIS — Z1389 Encounter for screening for other disorder: Secondary | ICD-10-CM | POA: Diagnosis not present

## 2018-02-18 DIAGNOSIS — E663 Overweight: Secondary | ICD-10-CM | POA: Diagnosis not present

## 2018-02-18 DIAGNOSIS — Z6825 Body mass index (BMI) 25.0-25.9, adult: Secondary | ICD-10-CM | POA: Diagnosis not present

## 2018-02-18 DIAGNOSIS — Z1389 Encounter for screening for other disorder: Secondary | ICD-10-CM | POA: Diagnosis not present

## 2018-02-21 ENCOUNTER — Encounter: Payer: Self-pay | Admitting: Internal Medicine

## 2018-03-06 DIAGNOSIS — E119 Type 2 diabetes mellitus without complications: Secondary | ICD-10-CM | POA: Diagnosis not present

## 2018-03-06 DIAGNOSIS — Z1389 Encounter for screening for other disorder: Secondary | ICD-10-CM | POA: Diagnosis not present

## 2018-03-06 DIAGNOSIS — E663 Overweight: Secondary | ICD-10-CM | POA: Diagnosis not present

## 2018-03-06 DIAGNOSIS — T50905A Adverse effect of unspecified drugs, medicaments and biological substances, initial encounter: Secondary | ICD-10-CM | POA: Diagnosis not present

## 2018-03-06 DIAGNOSIS — Z6827 Body mass index (BMI) 27.0-27.9, adult: Secondary | ICD-10-CM | POA: Diagnosis not present

## 2018-04-01 ENCOUNTER — Ambulatory Visit (INDEPENDENT_AMBULATORY_CARE_PROVIDER_SITE_OTHER): Payer: Self-pay

## 2018-04-01 DIAGNOSIS — Z86010 Personal history of colonic polyps: Secondary | ICD-10-CM

## 2018-04-01 MED ORDER — PEG 3350-KCL-NA BICARB-NACL 420 G PO SOLR: 4000.0000 mL | ORAL | 0 refills | Status: DC

## 2018-04-01 NOTE — Patient Instructions (Signed)
[name] Alvarado   Mar 21, 1956 MRN: [id number]    Procedure Date: 05/24/18 Time to register: 11:00 Place to register: [facility] Procedure Time: 12:00 Scheduled provider: R. Garfield Cornea, MD  PREPARATION FOR COLONOSCOPY WITH TRI-LYTE SPLIT PREP  Please notify us immediately if you are diabetic, take iron supplements, or if you are on Coumadin or any other blood thinners.    You will need to purchase 1 fleet enema and 1 box of Bisacodyl 13m tablets.   2 DAYS BEFORE PROCEDURE:  DATE: 05/22/18   DAY: Wednesday Begin clear liquid diet AFTER your lunch meal. NO SOLID FOODS after this point.  1 DAY BEFORE PROCEDURE:  DATE: 05/23/18   DAY: Thursday Continue clear liquids the entire day - NO SOLID FOOD.    At 2:00 pm:  Take 2 Bisacodyl tablets.   At 4:00pm:  Start drinking your solution. Make sure you mix well per instructions on the bottle. Try to drink 1 (one) 8 ounce glass every 10-15 minutes until you have consumed HALF the jug. You should complete by 6:00pm.You must keep the left over solution refrigerated until completed next day.  Continue clear liquids. You must drink plenty of clear liquids to prevent dehyration and kidney failure.     DAY OF PROCEDURE:   DATE: 05/24/18  DAY: Friday If you take medications for your heart, blood pressure or breathing, you may take these medications.    Five hours before your procedure time @ 7:00am:  Finish remaining amout of bowel prep, drinking 1 (one) 8 ounce glass every 10-15 minutes until complete. You have two hours to consume remaining prep.   Three hours before your procedure time _0 :00am:  Nothing by mouth.   At least one hour before going to the hospital:  Give yourself one Fleet enema. You may take your morning medications with sip of water unless we have instructed otherwise.      Please see below for Dietary Information.  CLEAR LIQUIDS INCLUDE:  Water Jello (NOT red in color)   Ice Popsicles (NOT red in color)    Tea (sugar ok, no milk/cream) Powdered fruit flavored drinks  Coffee (sugar ok, no milk/cream) Gatorade/ Lemonade/ Kool-Aid  (NOT red in color)   Juice: apple, white grape, white cranberry Soft drinks  Clear bullion, consomme, broth (fat free beef/chicken/vegetable)  Carbonated beverages (any kind)  Strained chicken noodle soup Hard Candy   Remember: Clear liquids are liquids that will allow you to see your fingers on the other side of a clear glass. Be sure liquids are NOT red in color, and not cloudy, but CLEAR.  DO NOT EAT OR DRINK ANY OF THE FOLLOWING:  Dairy products of any kind   Cranberry juice Tomato juice / V8 juice   Grapefruit juice Orange juice     Red grape juice  Do not eat any solid foods, including such foods as: cereal, oatmeal, yogurt, fruits, vegetables, creamed soups, eggs, bread, crackers, pureed foods in a blender, etc.   HELPFUL HINTS FOR DRINKING PREP SOLUTION:   Make sure prep is extremely cold. Mix and refrigerate the the morning of the prep. You may also put in the freezer.   You may try mixing some Crystal Light or Country Time Lemonade if you prefer. Mix in small amounts; add more if necessary.  Try drinking through a straw  Rinse mouth with water or a mouthwash between glasses, to remove after-taste.  Try sipping on a cold beverage /ice/ popsicles between glasses of prep.  Place a piece of sugar-free hard candy in mouth between glasses.  If you become nauseated, try consuming smaller amounts, or stretch out the time between glasses. Stop for 30-60 minutes, then slowly start back drinking.        OTHER INSTRUCTIONS  You will need a responsible adult at least 60 years of age to accompany you and drive you home. This person must remain in the waiting room during your procedure. The hospital will cancel your procedure if you do not have a responsible adult with you.   1. Wear loose fitting clothing that is easily removed. 2. Leave jewelry and  other valuables at home.  3. Remove all body piercing jewelry and leave at home. 4. Total time from sign-in until discharge is approximately 2-3 hours. 5. You should go home directly after your procedure and rest. You can resume normal activities the day after your procedure. 6. The day of your procedure you should not:  Drive  Make legal decisions  Operate machinery  Drink alcohol  Return to work   You may call the office (Dept: [phone]) before 5:00pm, or page the doctor on call [phone]) after 5:00pm, for further instructions, if necessary.   Insurance Information YOU WILL NEED TO CHECK WITH YOUR INSURANCE COMPANY FOR THE BENEFITS OF COVERAGE YOU HAVE FOR THIS PROCEDURE.  UNFORTUNATELY, NOT ALL INSURANCE COMPANIES HAVE BENEFITS TO COVER ALL OR PART OF THESE TYPES OF PROCEDURES.  IT IS YOUR RESPONSIBILITY TO CHECK YOUR BENEFITS, HOWEVER, WE WILL BE GLAD TO ASSIST YOU WITH ANY CODES YOUR INSURANCE COMPANY MAY NEED.    PLEASE NOTE THAT MOST INSURANCE COMPANIES WILL NOT COVER A SCREENING COLONOSCOPY FOR PEOPLE UNDER THE AGE OF 50  IF YOU HAVE BCBS INSURANCE, YOU MAY HAVE BENEFITS FOR A SCREENING COLONOSCOPY BUT IF POLYPS ARE FOUND THE DIAGNOSIS WILL CHANGE AND THEN YOU MAY HAVE A DEDUCTIBLE THAT WILL NEED TO BE MET. SO PLEASE MAKE SURE YOU CHECK YOUR BENEFITS FOR A SCREENING COLONOSCOPY AS WELL AS A DIAGNOSTIC COLONOSCOPY.

## 2018-04-01 NOTE — Progress Notes (Signed)
Gastroenterology Pre-Procedure Review  Request Date:04/01/18 Requesting Physician: 5 year recall RMR  PATIENT REVIEW QUESTIONS: The patient responded to the following health history questions as indicated:    1. Diabetes Melitis: no 2. Joint replacements in the past 12 months: no 3. Major health problems in the past 3 months: no 4. Has an artificial valve or MVP: no 5. Has a defibrillator: no 6. Has been advised in past to take antibiotics in advance of a procedure like teeth cleaning: no 7. Family history of colon cancer: no  8. Alcohol Use: no 9. History of sleep apnea: no  10. History of coronary artery or other vascular stents placed within the last 12 months: no 11. History of any prior anesthesia complications: no    MEDICATIONS & ALLERGIES:    Patient reports the following regarding taking any blood thinners:   Plavix? no Aspirin? yes (81mg ) Coumadin? no Brilinta? no Xarelto? no Eliquis? no Pradaxa? no Savaysa? no Effient? no  Patient confirms/reports the following medications:  Current Outpatient Medications  Medication Sig Dispense Refill  . aspirin EC 81 MG tablet Take 81 mg by mouth daily.    Marland Kitchen losartan-hydrochlorothiazide (HYZAAR) 100-25 MG tablet Take 1 tablet by mouth daily.  1   No current facility-administered medications for this visit.     Patient confirms/reports the following allergies:  No Known Allergies  No orders of the defined types were placed in this encounter.   AUTHORIZATION INFORMATION Primary Insurance: [place],  [place] #: [id number] Pre-Cert / Josem Kaufmann required: no   SCHEDULE INFORMATION: Procedure has been scheduled as follows:  Date: 05/24/18, Time: 12:00 Location: [facility] Dr.Rourk  This Gastroenterology Pre-Precedure Review Form is being routed to the following provider(s): Walden Field NP

## 2018-04-02 NOTE — Progress Notes (Signed)
Ok to schedule.

## 2018-05-24 ENCOUNTER — Ambulatory Visit (HOSPITAL_COMMUNITY)
Admission: RE | Admit: 2018-05-24 | Discharge: 2018-05-24 | Disposition: A | Discharge status: Home / Self Care | Payer: BLUE CROSS/BLUE SHIELD | Source: Ambulatory Visit | Attending: Internal Medicine | Admitting: Internal Medicine

## 2018-05-24 ENCOUNTER — Other Ambulatory Visit: Payer: Self-pay

## 2018-05-24 ENCOUNTER — Encounter (HOSPITAL_COMMUNITY)
Admission: RE | Disposition: A | Discharge status: Home / Self Care | Payer: Self-pay | Source: Ambulatory Visit | Attending: Internal Medicine

## 2018-05-24 ENCOUNTER — Encounter (HOSPITAL_COMMUNITY): Payer: Self-pay | Admitting: *Deleted

## 2018-05-24 DIAGNOSIS — I10 Essential (primary) hypertension: Secondary | ICD-10-CM | POA: Diagnosis not present

## 2018-05-24 DIAGNOSIS — K5730 Diverticulosis of large intestine without perforation or abscess without bleeding: Secondary | ICD-10-CM | POA: Insufficient documentation

## 2018-05-24 DIAGNOSIS — D175 Benign lipomatous neoplasm of intra-abdominal organs: Secondary | ICD-10-CM | POA: Diagnosis not present

## 2018-05-24 DIAGNOSIS — E7800 Pure hypercholesterolemia, unspecified: Secondary | ICD-10-CM | POA: Diagnosis not present

## 2018-05-24 DIAGNOSIS — Z1211 Encounter for screening for malignant neoplasm of colon: Secondary | ICD-10-CM | POA: Insufficient documentation

## 2018-05-24 DIAGNOSIS — K621 Rectal polyp: Secondary | ICD-10-CM | POA: Insufficient documentation

## 2018-05-24 DIAGNOSIS — Z79899 Other long term (current) drug therapy: Secondary | ICD-10-CM | POA: Diagnosis not present

## 2018-05-24 DIAGNOSIS — Z7982 Long term (current) use of aspirin: Secondary | ICD-10-CM | POA: Insufficient documentation

## 2018-05-24 DIAGNOSIS — Z86010 Personal history of colonic polyps: Secondary | ICD-10-CM | POA: Insufficient documentation

## 2018-05-24 HISTORY — PX: COLONOSCOPY: SHX5424

## 2018-05-24 HISTORY — PX: POLYPECTOMY: SHX5525

## 2018-05-24 SURGERY — COLONOSCOPY
Anesthesia: Moderate Sedation

## 2018-05-24 MED ORDER — SODIUM CHLORIDE 0.9 % IV SOLN
INTRAVENOUS | Status: DC
  Administered 2018-05-24: 11:00:00 via INTRAVENOUS

## 2018-05-24 MED ORDER — MEPERIDINE HCL 50 MG/ML IJ SOLN
INTRAMUSCULAR | Status: AC
  Filled 2018-05-24: qty 1

## 2018-05-24 MED ORDER — ONDANSETRON HCL 4 MG/2ML IJ SOLN
INTRAMUSCULAR | Status: AC
  Filled 2018-05-24: qty 2

## 2018-05-24 MED ORDER — ONDANSETRON HCL 4 MG/2ML IJ SOLN
INTRAMUSCULAR | Status: DC | PRN
  Administered 2018-05-24: 4 mg via INTRAVENOUS

## 2018-05-24 MED ORDER — MIDAZOLAM HCL 5 MG/5ML IJ SOLN
INTRAMUSCULAR | Status: DC | PRN
  Administered 2018-05-24: 2 mg via INTRAVENOUS
  Administered 2018-05-24 (×2): 1 mg via INTRAVENOUS

## 2018-05-24 MED ORDER — MIDAZOLAM HCL 5 MG/5ML IJ SOLN
INTRAMUSCULAR | Status: AC
  Filled 2018-05-24: qty 10

## 2018-05-24 MED ORDER — MEPERIDINE HCL 100 MG/ML IJ SOLN
INTRAMUSCULAR | Status: DC | PRN
  Administered 2018-05-24: 25 mg via INTRAVENOUS
  Administered 2018-05-24: 15 mg via INTRAVENOUS

## 2018-05-24 NOTE — Discharge Instructions (Signed)
°Colonoscopy °Discharge Instructions ° °Read the instructions outlined below and refer to this sheet in the next few weeks. These discharge instructions provide you with general information on caring for yourself after you leave the hospital. Your doctor may also give you specific instructions. While your treatment has been planned according to the most current medical practices available, unavoidable complications occasionally occur. If you have any problems or questions after discharge, call Dr. Rourk at [phone]. °ACTIVITY °· You may resume your regular activity, but move at a slower pace for the next 24 hours.  °· Take frequent rest periods for the next 24 hours.  °· Walking will help get rid of the air and reduce the bloated feeling in your belly (abdomen).  °· No driving for 24 hours (because of the medicine (anesthesia) used during the test).   °· Do not sign any important legal documents or operate any machinery for 24 hours (because of the anesthesia used during the test).  °NUTRITION °· Drink plenty of fluids.  °· You may resume your normal diet as instructed by your doctor.  °· Begin with a light meal and progress to your normal diet. Heavy or fried foods are harder to digest and may make you feel sick to your stomach (nauseated).  °· Avoid alcoholic beverages for 24 hours or as instructed.  °MEDICATIONS °· You may resume your normal medications unless your doctor tells you otherwise.  °WHAT YOU CAN EXPECT TODAY °· Some feelings of bloating in the abdomen.  °· Passage of more gas than usual.  °· Spotting of blood in your stool or on the toilet paper.  °IF YOU HAD POLYPS REMOVED DURING THE COLONOSCOPY: °· No aspirin products for 7 days or as instructed.  °· No alcohol for 7 days or as instructed.  °· Eat a soft diet for the next 24 hours.  °FINDING OUT THE RESULTS OF YOUR TEST °Not all test results are available during your visit. If your test results are not back during the visit, make an appointment  with your caregiver to find out the results. Do not assume everything is normal if you have not heard from your caregiver or the medical facility. It is important for you to follow up on all of your test results.  °SEEK IMMEDIATE MEDICAL ATTENTION IF: °· You have more than a spotting of blood in your stool.  °· Your belly is swollen (abdominal distention).  °· You are nauseated or vomiting.  °· You have a temperature over 101.  °· You have abdominal pain or discomfort that is severe or gets worse throughout the day.  ° ° ° °Colon polyp and diverticulosis information provided ° °Further recommendations to follow pending review of pathology report ° ° °Diverticulosis °Diverticulosis is a condition that develops when small pouches (diverticula) form in the wall of the large intestine (colon). The colon is where water is absorbed and stool is formed. The pouches form when the inside layer of the colon pushes through weak spots in the outer layers of the colon. You may have a few pouches or many of them. °What are the causes? °The cause of this condition is not known. °What increases the risk? °The following factors may make you more likely to develop this condition: °· Being older than age 60. Your risk for this condition increases with age. Diverticulosis is rare among people younger than age 30. By age 80, many people have it. °· Eating a low-fiber diet. °· Having frequent constipation. °· Being   Not getting enough exercise.  Smoking.  Taking over-the-counter pain medicines, like aspirin and ibuprofen.  Having a family history of diverticulosis.  What are the signs or symptoms? In most people, there are no symptoms of this condition. If you do have symptoms, they may include:  Bloating.  Cramps in the abdomen.  Constipation or diarrhea.  Pain in the lower left side of the abdomen.  How is this diagnosed? This condition is most often diagnosed during an exam for other colon problems.  Because diverticulosis usually has no symptoms, it often cannot be diagnosed independently. This condition may be diagnosed by:  Using a flexible scope to examine the colon (colonoscopy).  Taking an X-ray of the colon after dye has been put into the colon (barium enema).  Doing a CT scan.  How is this treated? You may not need treatment for this condition if you have never developed an infection related to diverticulosis. If you have had an infection before, treatment may include:  Eating a high-fiber diet. This may include eating more fruits, vegetables, and grains.  Taking a fiber supplement.  Taking a live bacteria supplement (probiotic).  Taking medicine to relax your colon.  Taking antibiotic medicines.  Follow these instructions at home:  Drink 6-8 glasses of water or more each day to prevent constipation.  Try not to strain when you have a bowel movement.  If you have had an infection before: ? Eat more fiber as directed by your health care provider or your diet and nutrition specialist (dietitian). ? Take a fiber supplement or probiotic, if your health care provider approves.  Take over-the-counter and prescription medicines only as told by your health care provider.  If you were prescribed an antibiotic, take it as told by your health care provider. Do not stop taking the antibiotic even if you start to feel better.  Keep all follow-up visits as told by your health care provider. This is important. Contact a health care provider if:  You have pain in your abdomen.  You have bloating.  You have cramps.  You have not had a bowel movement in 3 days. Get help right away if:  Your pain gets worse.  Your bloating becomes very bad.  You have a fever or chills, and your symptoms suddenly get worse.  You vomit.  You have bowel movements that are bloody or black.  You have bleeding from your rectum. Summary  Diverticulosis is a condition that develops when  small pouches (diverticula) form in the wall of the large intestine (colon).  You may have a few pouches or many of them.  This condition is most often diagnosed during an exam for other colon problems.  If you have had an infection related to diverticulosis, treatment may include increasing the fiber in your diet, taking supplements, or taking medicines. This information is not intended to replace advice given to you by your health care provider. Make sure you discuss any questions you have with your health care provider. Document Released: 04/27/2004 Document Revised: 06/19/2016 Document Reviewed: 06/19/2016 Elsevier Interactive Patient Education  2017 [place].  Colon Polyps Polyps are tissue growths inside the body. Polyps can grow in many places, including the large intestine (colon). A polyp may be a round bump or a mushroom-shaped growth. You could have one polyp or several. Most colon polyps are noncancerous (benign). However, some colon polyps can become cancerous over time. What are the causes? The exact cause of colon polyps is not known. What  known. °What increases the risk? °This condition is more likely to develop in people who: °· Have a family history of colon cancer or colon polyps. °· Are older than 50 or older than 45 if they are African American. °· Have inflammatory bowel disease, such as ulcerative colitis or Crohn disease. °· Are overweight. °· Smoke cigarettes. °· Do not get enough exercise. °· Drink too much alcohol. °· Eat a diet that is: °? High in fat and red meat. °? Low in fiber. °· Had childhood cancer that was treated with abdominal radiation. ° °What are the signs or symptoms? °Most polyps do not cause symptoms. If you have symptoms, they may include: °· Blood coming from your rectum when having a bowel movement. °· Blood in your stool. The stool may look dark red or black. °· A change in bowel habits, such as constipation or diarrhea. ° °How is this diagnosed? °This  condition is diagnosed with a colonoscopy. This is a procedure that uses a lighted, flexible scope to look at the inside of your colon. °How is this treated? °Treatment for this condition involves removing any polyps that are found. Those polyps will then be tested for cancer. If cancer is found, your health care provider will talk to you about options for colon cancer treatment. °Follow these instructions at home: °Diet °· Eat plenty of fiber, such as fruits, vegetables, and whole grains. °· Eat foods that are high in calcium and vitamin D, such as milk, cheese, yogurt, eggs, liver, fish, and broccoli. °· Limit foods high in fat, red meats, and processed meats, such as hot dogs, sausage, bacon, and lunch meats. °· Maintain a healthy weight, or lose weight if recommended by your health care provider. °General instructions °· Do not smoke cigarettes. °· Do not drink alcohol excessively. °· Keep all follow-up visits as told by your health care provider. This is important. This includes keeping regularly scheduled colonoscopies. Talk to your health care provider about when you need a colonoscopy. °· Exercise every day or as told by your health care provider. °Contact a health care provider if: °· You have new or worsening bleeding during a bowel movement. °· You have new or increased blood in your stool. °· You have a change in bowel habits. °· You unexpectedly lose weight. °This information is not intended to replace advice given to you by your health care provider. Make sure you discuss any questions you have with your health care provider. °Document Released: 04/26/2004 Document Revised: 01/06/2016 Document Reviewed: 06/21/2015 °Elsevier Interactive Patient Education © 2018 [employer] ° °

## 2018-05-24 NOTE — H&P (Signed)
@LOGO @   Primary Care Physician:  Redmond School, MD Primary Gastroenterologist:  Dr. Gala Romney  Pre-Procedure History & Physical: HPI:  [name] Alvarado is a 62 y.o. male here for surveillance colonoscopy.  History of multiple colonic adenomas removed over multiple colonoscopies previously.  Past Medical History:  Diagnosis Date  . Diverticula of colon   . Fatty liver   . Hemorrhoids   . HTN (hypertension)   . Hypercholesterolemia   . Tubular adenoma     Past Surgical History:  Procedure Laterality Date  . COLONOSCOPY  09/10/2007   Dr. Gala Romney- friable himorrhoids (internal and anal canal) o/w normal rectum,sigmoid diverticula, bx= tubular adenoma  . COLONOSCOPY N/A 05/16/2013   Procedure: COLONOSCOPY;  Surgeon: Daneil Dolin, MD;  [facility]: [facility];  Service: Endoscopy;  Laterality: N/A;  7:30  . HAND SURGERY     L  . SHOULDER SURGERY     bilat  . TONSILLECTOMY      Prior to Admission medications   Medication Sig Start Date End Date Taking? Authorizing Provider  aspirin EC 81 MG tablet Take 81 mg by mouth daily.   Yes [provider]  EPINEPHrine 0.3 mg/0.3 mL IJ SOAJ injection See admin instructions. 04/01/18  Yes [provider]  lisinopril-hydrochlorothiazide (PRINZIDE,ZESTORETIC) 20-12.5 MG tablet Take 2 tablets by mouth daily. 05/10/18  Yes [provider]    Allergies as of 04/01/2018  . (No Known Allergies)    Family History  Problem Relation Age of Onset  . Colon cancer Neg Hx     Social History   Socioeconomic History  . Marital status: Divorced    Spouse name: Not on file  . Number of children: Not on file  . Years of education: Not on file  . Highest education level: Not on file  Occupational History  . Not on file  Social Needs  . Financial resource strain: Not on file  . Food insecurity:    Worry: Not on file    Inability: Not on file  . Transportation needs:    Medical: Not on file    Non-medical: Not on file   Tobacco Use  . Smoking status: Never Smoker  . Smokeless tobacco: Never Used  Substance and Sexual Activity  . Alcohol use: No  . Drug use: No  . Sexual activity: Yes    Birth control/protection: None  Lifestyle  . Physical activity:    Days per week: Not on file    Minutes per session: Not on file  . Stress: Not on file  Relationships  . Social connections:    Talks on phone: Not on file    Gets together: Not on file    Attends religious service: Not on file    Active member of club or organization: Not on file    Attends meetings of clubs or organizations: Not on file    Relationship status: Not on file  . Intimate partner violence:    Fear of current or ex partner: Not on file    Emotionally abused: Not on file    Physically abused: Not on file    Forced sexual activity: Not on file  Other Topics Concern  . Not on file  Social History Narrative  . Not on file    Review of Systems: See HPI, otherwise negative ROS  Physical Exam: BP 136/77   Pulse 68   Temp 98.5 F (36.9 C) (Oral)   Resp 12   Ht 5\' 11"  (1.803  m)   Wt 85.3 kg   SpO2 99%   BMI 26.22 kg/m  General:   Alert,  Well-developed, well-nourished, pleasant and cooperative in NAD Neck:  Supple; no masses or thyromegaly. No significant cervical adenopathy. Lungs:  Clear throughout to auscultation.   No wheezes, crackles, or rhonchi. No acute distress. Heart:  Regular rate and rhythm; no murmurs, clicks, rubs,  or gallops. Abdomen: Non-distended, normal bowel sounds.  Soft and nontender without appreciable mass or hepatosplenomegaly.  Pulses:  Normal pulses noted. Extremities:  Without clubbing or edema.  Impression/Plan: 62 year old gentleman history of multiple colonic polyps removed multiple colonoscopies previously.  Here for surveillance colonoscopy.  The risks, benefits, limitations, alternatives and imponderables have been reviewed with the patient. Questions have been answered. All parties are  agreeable.      Notice: This dictation was prepared with Dragon dictation along with smaller phrase technology. Any transcriptional errors that result from this process are unintentional and may not be corrected upon review.

## 2018-05-24 NOTE — Op Note (Signed)
[facility] Patient Name: [name] Alvarado Procedure Date: 05/24/2018 10:26 AM MRN: [id number] Date of Birth: March 12, 1956 Attending MD: Norvel Richards , MD CSN: [id number] Age: 62 Admit Type: Outpatient Procedure:                Colonoscopy Indications:              Screening for colorectal malignant neoplasm, High                            risk colon cancer surveillance: Personal history of                            colonic polyps Providers:                Norvel Richards, MD, Janeece Riggers, RN, Aram Candela Referring MD:              Medicines:                Midazolam 4 mg IV, Meperidine 40 mg IV, Ondansetron                            4 mg IV Complications:            No immediate complications. Estimated Blood Loss:     Estimated blood loss was minimal. Procedure:                Pre-Anesthesia Assessment:                           - Prior to the procedure, a History and Physical                            was performed, and patient medications and                            allergies were reviewed. The patient's tolerance of                            previous anesthesia was also reviewed. The risks                            and benefits of the procedure and the sedation                            options and risks were discussed with the patient.                            All questions were answered, and informed consent                            was obtained. Prior Anticoagulants: The patient has  taken no previous anticoagulant or antiplatelet                            agents. ASA Grade Assessment: II - A patient with                            mild systemic disease. After reviewing the risks                            and benefits, the patient was deemed in                            satisfactory condition to undergo the procedure.                           After obtaining informed consent, the colonoscope                             was passed under direct vision. Throughout the                            procedure, the patient's blood pressure, pulse, and                            oxygen saturations were monitored continuously. The                            CF-HQ190L ([id number]) scope was introduced through                            the anus and advanced to the the cecum, identified                            by appendiceal orifice and ileocecal valve. The                            colonoscopy was performed without difficulty. The                            patient tolerated the procedure well. The quality                            of the bowel preparation was adequate. Scope In: 10:58:36 AM Scope Out: 11:13:39 AM Scope Withdrawal Time: 0 hours 9 minutes 37 seconds  Total Procedure Duration: 0 hours 15 minutes 3 seconds  Findings:      The perianal and digital rectal examinations were normal.      Scattered small and large-mouthed diverticula were found in the entire       colon.      A 5 mm polyp was found in the rectum. The polyp was sessile. The polyp       was removed with a cold snare. Resection and retrieval were complete.       Estimated blood loss was minimal. 2 cm lipoma distal transverse colon.  The exam was otherwise without abnormality on direct and retroflexion       views. Impression:               - Diverticulosis in the entire examined colon.                           - One 5 mm polyp in the rectum, removed with a cold                            snare. Resected and retrieved. Colonic lipoma.                           - The examination was otherwise normal on direct                            and retroflexion views. Moderate Sedation:      Moderate (conscious) sedation was administered by the endoscopy nurse       and supervised by the endoscopist. The following parameters were       monitored: oxygen saturation, heart rate, blood pressure, respiratory       rate,  EKG, adequacy of pulmonary ventilation, and response to care.       Total physician intraservice time was 14 minutes. Recommendation:           - Patient has a contact number available for                            emergencies. The signs and symptoms of potential                            delayed complications were discussed with the                            patient. Return to normal activities tomorrow.                            Written discharge instructions were provided to the                            patient.                           - Resume previous diet.                           - Continue present medications.                           - Repeat colonoscopy date to be determined after                            pending pathology results are reviewed for                            surveillance.                           -  Return to GI office (date not yet determined). Procedure Code(s):        --- Professional ---                           [phone], Colonoscopy, flexible; with removal of                            tumor(s), polyp(s), or other lesion(s) by snare                            technique                           G0500, Moderate sedation services provided by the                            same physician or other qualified health care                            professional performing a gastrointestinal                            endoscopic service that sedation supports,                            requiring the presence of an independent trained                            observer to assist in the monitoring of the                            patient's level of consciousness and physiological                            status; initial 15 minutes of intra-service time;                            patient age 66 years or older (additional time may                            be reported with [phone], as appropriate) Diagnosis Code(s):        --- Professional ---                            Z12.11, Encounter for screening for malignant                            neoplasm of colon                           Z86.010, Personal history of colonic polyps                           K62.1, Rectal polyp  K57.30, Diverticulosis of large intestine without                            perforation or abscess without bleeding CPT copyright 2018 American Medical Association. All rights reserved. The codes documented in this report are preliminary and upon coder review may  be revised to meet current compliance requirements. Cristopher Estimable. Nathanie Ottley, MD Norvel Richards, MD 05/24/2018 11:19:51 AM This report has been signed electronically. Number of Addenda: 0

## 2018-05-29 ENCOUNTER — Encounter: Payer: Self-pay | Admitting: Internal Medicine

## 2018-05-30 ENCOUNTER — Encounter (HOSPITAL_COMMUNITY): Payer: Self-pay | Admitting: Internal Medicine

## 2018-06-05 DIAGNOSIS — Z23 Encounter for immunization: Secondary | ICD-10-CM | POA: Diagnosis not present

## 2018-07-05 DIAGNOSIS — E663 Overweight: Secondary | ICD-10-CM | POA: Diagnosis not present

## 2018-07-05 DIAGNOSIS — Z6826 Body mass index (BMI) 26.0-26.9, adult: Secondary | ICD-10-CM | POA: Diagnosis not present

## 2018-07-05 DIAGNOSIS — R200 Anesthesia of skin: Secondary | ICD-10-CM | POA: Diagnosis not present

## 2018-07-05 DIAGNOSIS — Z1389 Encounter for screening for other disorder: Secondary | ICD-10-CM | POA: Diagnosis not present

## 2018-08-09 DIAGNOSIS — M542 Cervicalgia: Secondary | ICD-10-CM | POA: Diagnosis not present

## 2018-08-09 DIAGNOSIS — R202 Paresthesia of skin: Secondary | ICD-10-CM | POA: Diagnosis not present

## 2018-08-09 DIAGNOSIS — I10 Essential (primary) hypertension: Secondary | ICD-10-CM | POA: Diagnosis not present

## 2018-08-09 DIAGNOSIS — G5603 Carpal tunnel syndrome, bilateral upper limbs: Secondary | ICD-10-CM | POA: Diagnosis not present

## 2018-08-20 DIAGNOSIS — D2272 Melanocytic nevi of left lower limb, including hip: Secondary | ICD-10-CM | POA: Diagnosis not present

## 2018-08-20 DIAGNOSIS — L570 Actinic keratosis: Secondary | ICD-10-CM | POA: Diagnosis not present

## 2018-08-20 DIAGNOSIS — L820 Inflamed seborrheic keratosis: Secondary | ICD-10-CM | POA: Diagnosis not present

## 2018-08-20 DIAGNOSIS — L821 Other seborrheic keratosis: Secondary | ICD-10-CM | POA: Diagnosis not present

## 2018-08-20 DIAGNOSIS — D485 Neoplasm of uncertain behavior of skin: Secondary | ICD-10-CM | POA: Diagnosis not present

## 2018-08-20 DIAGNOSIS — D2262 Melanocytic nevi of left upper limb, including shoulder: Secondary | ICD-10-CM | POA: Diagnosis not present

## 2018-09-03 DIAGNOSIS — Z1389 Encounter for screening for other disorder: Secondary | ICD-10-CM | POA: Diagnosis not present

## 2018-09-03 DIAGNOSIS — M545 Low back pain: Secondary | ICD-10-CM | POA: Diagnosis not present

## 2018-09-03 DIAGNOSIS — E663 Overweight: Secondary | ICD-10-CM | POA: Diagnosis not present

## 2018-09-03 DIAGNOSIS — Z6827 Body mass index (BMI) 27.0-27.9, adult: Secondary | ICD-10-CM | POA: Diagnosis not present

## 2018-09-06 ENCOUNTER — Ambulatory Visit (HOSPITAL_COMMUNITY)
Admission: RE | Admit: 2018-09-06 | Discharge: 2018-09-06 | Disposition: A | Discharge status: Home / Self Care | Payer: BLUE CROSS/BLUE SHIELD | Source: Ambulatory Visit | Attending: Family Medicine | Admitting: Family Medicine

## 2018-09-06 ENCOUNTER — Other Ambulatory Visit (HOSPITAL_COMMUNITY): Payer: Self-pay | Admitting: Family Medicine

## 2018-09-06 DIAGNOSIS — Z1389 Encounter for screening for other disorder: Secondary | ICD-10-CM | POA: Diagnosis not present

## 2018-09-06 DIAGNOSIS — E663 Overweight: Secondary | ICD-10-CM | POA: Diagnosis not present

## 2018-09-06 DIAGNOSIS — Z6828 Body mass index (BMI) 28.0-28.9, adult: Secondary | ICD-10-CM | POA: Diagnosis not present

## 2018-09-06 DIAGNOSIS — R0781 Pleurodynia: Secondary | ICD-10-CM | POA: Insufficient documentation

## 2018-09-06 DIAGNOSIS — S299XXA Unspecified injury of thorax, initial encounter: Secondary | ICD-10-CM | POA: Diagnosis not present

## 2018-09-10 DIAGNOSIS — Z6828 Body mass index (BMI) 28.0-28.9, adult: Secondary | ICD-10-CM | POA: Diagnosis not present

## 2018-09-10 DIAGNOSIS — Z1389 Encounter for screening for other disorder: Secondary | ICD-10-CM | POA: Diagnosis not present

## 2018-09-10 DIAGNOSIS — E663 Overweight: Secondary | ICD-10-CM | POA: Diagnosis not present

## 2018-09-10 DIAGNOSIS — M545 Low back pain: Secondary | ICD-10-CM | POA: Diagnosis not present

## 2019-01-09 DIAGNOSIS — M5412 Radiculopathy, cervical region: Secondary | ICD-10-CM | POA: Diagnosis not present

## 2019-01-09 DIAGNOSIS — G5602 Carpal tunnel syndrome, left upper limb: Secondary | ICD-10-CM | POA: Diagnosis not present

## 2019-01-10 DIAGNOSIS — T07XXXA Unspecified multiple injuries, initial encounter: Secondary | ICD-10-CM | POA: Diagnosis not present

## 2019-01-10 DIAGNOSIS — Z6827 Body mass index (BMI) 27.0-27.9, adult: Secondary | ICD-10-CM | POA: Diagnosis not present

## 2019-01-10 DIAGNOSIS — E663 Overweight: Secondary | ICD-10-CM | POA: Diagnosis not present

## 2019-01-10 DIAGNOSIS — M19219 Secondary osteoarthritis, unspecified shoulder: Secondary | ICD-10-CM | POA: Diagnosis not present

## 2019-01-10 DIAGNOSIS — Z1389 Encounter for screening for other disorder: Secondary | ICD-10-CM | POA: Diagnosis not present

## 2019-05-16 DIAGNOSIS — E663 Overweight: Secondary | ICD-10-CM | POA: Diagnosis not present

## 2019-05-16 DIAGNOSIS — L259 Unspecified contact dermatitis, unspecified cause: Secondary | ICD-10-CM | POA: Diagnosis not present

## 2019-05-16 DIAGNOSIS — Z6827 Body mass index (BMI) 27.0-27.9, adult: Secondary | ICD-10-CM | POA: Diagnosis not present

## 2019-05-16 DIAGNOSIS — Z23 Encounter for immunization: Secondary | ICD-10-CM | POA: Diagnosis not present

## 2020-04-29 ENCOUNTER — Emergency Department (HOSPITAL_COMMUNITY)
Admission: EM | Admit: 2020-04-29 | Discharge: 2020-04-29 | Disposition: A | Discharge status: Home / Self Care | Payer: No Typology Code available for payment source | Attending: Emergency Medicine | Admitting: Emergency Medicine

## 2020-04-29 ENCOUNTER — Other Ambulatory Visit: Payer: Self-pay

## 2020-04-29 ENCOUNTER — Emergency Department (HOSPITAL_COMMUNITY): Payer: No Typology Code available for payment source

## 2020-04-29 DIAGNOSIS — Y9389 Activity, other specified: Secondary | ICD-10-CM | POA: Insufficient documentation

## 2020-04-29 DIAGNOSIS — W231XXA Caught, crushed, jammed, or pinched between stationary objects, initial encounter: Secondary | ICD-10-CM | POA: Diagnosis not present

## 2020-04-29 DIAGNOSIS — S6992XA Unspecified injury of left wrist, hand and finger(s), initial encounter: Secondary | ICD-10-CM | POA: Diagnosis present

## 2020-04-29 DIAGNOSIS — S61211A Laceration without foreign body of left index finger without damage to nail, initial encounter: Secondary | ICD-10-CM | POA: Insufficient documentation

## 2020-04-29 DIAGNOSIS — Z23 Encounter for immunization: Secondary | ICD-10-CM | POA: Diagnosis not present

## 2020-04-29 DIAGNOSIS — Y9289 Other specified places as the place of occurrence of the external cause: Secondary | ICD-10-CM | POA: Diagnosis not present

## 2020-04-29 DIAGNOSIS — Z7982 Long term (current) use of aspirin: Secondary | ICD-10-CM | POA: Diagnosis not present

## 2020-04-29 DIAGNOSIS — I10 Essential (primary) hypertension: Secondary | ICD-10-CM | POA: Diagnosis not present

## 2020-04-29 DIAGNOSIS — Z79899 Other long term (current) drug therapy: Secondary | ICD-10-CM | POA: Diagnosis not present

## 2020-04-29 LAB — RAPID URINE DRUG SCREEN, HOSP PERFORMED
Amphetamines: NOT DETECTED
Barbiturates: NOT DETECTED
Benzodiazepines: NOT DETECTED
Cocaine: NOT DETECTED
Opiates: POSITIVE — AB
Tetrahydrocannabinol: NOT DETECTED

## 2020-04-29 MED ORDER — SODIUM CHLORIDE 0.9 % IV SOLN
3.0000 g | Freq: Once | INTRAVENOUS | Status: AC
  Administered 2020-04-29: 3 g via INTRAVENOUS
  Filled 2020-04-29: qty 8

## 2020-04-29 MED ORDER — LIDOCAINE HCL (PF) 1 % IJ SOLN
INTRAMUSCULAR | Status: AC
  Filled 2020-04-29: qty 30

## 2020-04-29 MED ORDER — LIDOCAINE-EPINEPHRINE 1 %-1:100000 IJ SOLN
10.0000 mL | Freq: Once | INTRAMUSCULAR | Status: DC
  Filled 2020-04-29: qty 10

## 2020-04-29 MED ORDER — TETANUS-DIPHTH-ACELL PERTUSSIS 5-2.5-18.5 LF-MCG/0.5 IM SUSP
0.5000 mL | Freq: Once | INTRAMUSCULAR | Status: AC
  Administered 2020-04-29: 0.5 mL via INTRAMUSCULAR
  Filled 2020-04-29: qty 0.5

## 2020-04-29 MED ORDER — MORPHINE SULFATE (PF) 4 MG/ML IV SOLN
4.0000 mg | Freq: Once | INTRAVENOUS | Status: AC
  Administered 2020-04-29: 4 mg via INTRAVENOUS
  Filled 2020-04-29: qty 1

## 2020-04-29 MED ORDER — BACITRACIN ZINC 500 UNIT/GM EX OINT
1.0000 "application " | TOPICAL_OINTMENT | Freq: Two times a day (BID) | CUTANEOUS | Status: DC
  Filled 2020-04-29: qty 0.9

## 2020-04-29 MED ORDER — CEPHALEXIN 500 MG PO CAPS: 500.0000 mg | ORAL_CAPSULE | Freq: Two times a day (BID) | ORAL | 0 refills | Status: AC

## 2020-04-29 MED ORDER — HYDROCODONE-ACETAMINOPHEN 5-325 MG PO TABS: 2.0000 | ORAL_TABLET | ORAL | 0 refills | Status: DC | PRN

## 2020-04-29 NOTE — ED Provider Notes (Signed)
[facility] EMERGENCY DEPARTMENT Provider Note   CSN: [id number] Arrival date & time: 04/29/20  1823     History Chief Complaint  Patient presents with   Laceration    [name] Alvarado is a 64 y.o. male.  HPI   R hand dominant patient - had his hand / glove caught in a grinder (used to grind rubber tires), which occurred just prior to arrival - he caught the L index finger in the blades - bleeding controlled with a dressing - no n/v/cp/sob/f/c or any other c/o.  Pain is constant and mild to moderate -states that the finger has a bend / flexed tip which he can't overcome.  Past Medical History:  Diagnosis Date   Diverticula of colon    Fatty liver    Hemorrhoids    HTN (hypertension)    Hypercholesterolemia    Tubular adenoma     Patient Active Problem List   Diagnosis Date Noted   Personal history of colonic polyps 04/30/2013    Past Surgical History:  Procedure Laterality Date   COLONOSCOPY  09/10/2007   Dr. Gala Romney- friable himorrhoids (internal and anal canal) o/w normal rectum,sigmoid diverticula, bx= tubular adenoma   COLONOSCOPY N/A 05/16/2013   Procedure: COLONOSCOPY;  Surgeon: Daneil Dolin, MD;  Location: [facility];  Service: Endoscopy;  Laterality: N/A;  7:30   COLONOSCOPY N/A 05/24/2018   Procedure: COLONOSCOPY;  Surgeon: Daneil Dolin, MD;  Location: [facility];  Service: Endoscopy;  Laterality: N/A;  12:00   HAND SURGERY     L   POLYPECTOMY  05/24/2018   Procedure: POLYPECTOMY;  Surgeon: Daneil Dolin, MD;  Location: [facility];  Service: Endoscopy;;   SHOULDER SURGERY     bilat   TONSILLECTOMY         Family History  Problem Relation Age of Onset   Colon cancer Neg Hx     Social History   Tobacco Use   Smoking status: Never Smoker   Smokeless tobacco: Never Used  Vaping Use   Vaping Use: Never used  Substance Use Topics   Alcohol use: No   Drug use: No    Home Medications Prior to Admission medications    Medication Sig Start Date End Date Taking? Authorizing Provider  aspirin EC 81 MG tablet Take 81 mg by mouth daily.    [provider]  cephALEXin (KEFLEX) 500 MG capsule Take 1 capsule (500 mg total) by mouth 2 (two) times daily for 7 days. 04/29/20 05/06/20  Noemi Chapel, MD  EPINEPHrine 0.3 mg/0.3 mL IJ SOAJ injection See admin instructions. 04/01/18   [provider]  HYDROcodone-acetaminophen (NORCO/VICODIN) 5-325 MG tablet Take 2 tablets by mouth every 4 (four) hours as needed. 04/29/20   Noemi Chapel, MD  lisinopril-hydrochlorothiazide (PRINZIDE,ZESTORETIC) 20-12.5 MG tablet Take 2 tablets by mouth daily. 05/10/18   [provider]    Allergies    Bee venom  Review of Systems   Review of Systems  Gastrointestinal: Negative for nausea and vomiting.  Musculoskeletal: Positive for joint swelling.  Skin: Positive for wound.  Neurological: Negative for light-headedness and numbness.    Physical Exam Updated Vital Signs BP (!) 175/101 (BP Location: Right Arm)    Pulse 80    Temp 99 F (37.2 C) (Oral)    Ht 1.803 m (5\' 11" )    Wt 83.9 kg    SpO2 98%    BMI 25.80 kg/m   Physical Exam Vitals and nursing note  reviewed.  Constitutional:      Appearance: He is well-developed. He is not diaphoretic.  HENT:     Head: Normocephalic and atraumatic.  Eyes:     General:        Right eye: No discharge.        Left eye: No discharge.     Conjunctiva/sclera: Conjunctivae normal.  Pulmonary:     Effort: Pulmonary effort is normal. No respiratory distress.  Musculoskeletal:        General: Tenderness and deformity present.     Comments: Can't extend tip of the L index finger.  Skin:    General: Skin is warm and dry.     Findings: No erythema or rash.     Comments: Left index finger with 2 parallel lacerations to the finger - flexor and extensor - open wounds.  Neurological:     Mental Status: He is alert.     Coordination: Coordination normal.           ED Results / Procedures / Treatments   Labs (all labs ordered are listed, but only abnormal results are displayed) Labs Reviewed  RAPID URINE DRUG SCREEN, HOSP PERFORMED    EKG None  Radiology DG Finger Index Left  Result Date: 04/29/2020 CLINICAL DATA:  Hep index finger laceration and injury EXAM: LEFT INDEX FINGER 2+V COMPARISON:  None. FINDINGS: There is no evidence of fracture or dislocation. Overlying laceration and soft tissue swelling seen along the dorsal aspect of the second digit. There is slight flexion deformity noted of the D IP joint. IMPRESSION: Overlying soft tissue swelling and laceration at the dorsal aspect of the second digit with slight flexion deformity at the D IP joint. No definite fracture seen. Electronically Signed   By: Prudencio Pair M.D.   On: 04/29/2020 19:13    Procedures .Marland KitchenLaceration Repair  Date/Time: 04/29/2020 8:55 PM Performed by: Noemi Chapel, MD Authorized by: Noemi Chapel, MD   Consent:    Consent obtained:  Verbal   Consent given by:  Patient   Risks discussed:  Infection, pain, need for additional repair, poor cosmetic result and poor wound healing   Alternatives discussed:  No treatment and delayed treatment Anesthesia (see MAR for exact dosages):    Anesthesia method:  Nerve block   Block location:  Digital, 2nd, Left   Block needle gauge:  25 G   Block anesthetic:  Lidocaine 1% w/o epi   Block injection procedure:  Anatomic landmarks identified, introduced needle, incremental injection and negative aspiration for blood   Block outcome:  Anesthesia achieved Laceration details:    Location:  Finger   Finger location:  L index finger   Length (cm):  4   Depth (mm):  3 Repair type:    Repair type:  Simple Pre-procedure details:    Preparation:  Patient was prepped and draped in usual sterile fashion and imaging obtained to evaluate for foreign bodies Exploration:    Hemostasis achieved with:  Direct pressure    Wound exploration: wound explored through full range of motion and entire depth of wound probed and visualized     Wound extent: tendon damage     Wound extent: no fascia violation noted, no foreign bodies/material noted, no muscle damage noted, no nerve damage noted, no underlying fracture noted and no vascular damage noted     Tendon damage location:  Upper extremity   Upper extremity tendon damage location:  Finger extensor   Finger extensor tendon:  Extensor indicis  Tendon damage extent:  Complete transection   Tendon repair plan:  Refer for evaluation Treatment:    Area cleansed with:  Betadine   Amount of cleaning:  Standard   Irrigation solution:  Sterile saline   Irrigation volume:  2000 Skin repair:    Repair method:  Sutures   Suture size:  3-0   Suture material:  Nylon   Suture technique:  Simple interrupted   Number of sutures:  4 Approximation:    Approximation:  Close Post-procedure details:    Dressing:  Antibiotic ointment and sterile dressing   Patient tolerance of procedure:  Tolerated well, no immediate complications Comments:       Marland KitchenMarland KitchenLaceration Repair  Date/Time: 04/29/2020 8:57 PM Performed by: Noemi Chapel, MD Authorized by: Noemi Chapel, MD   Consent:    Consent obtained:  Verbal   Consent given by:  Patient   Risks discussed:  Infection, pain, need for additional repair, poor cosmetic result and poor wound healing   Alternatives discussed:  No treatment and delayed treatment Anesthesia (see MAR for exact dosages):    Anesthesia method:  Nerve block   Block location:  Digital Left 2nd finger   Block needle gauge:  25 G   Block anesthetic:  Lidocaine 1% w/o epi   Block injection procedure:  Anatomic landmarks identified   Block outcome:  Anesthesia achieved Laceration details:    Location:  Finger   Finger location:  L index finger   Length (cm):  4   Depth (mm):  3 Repair type:    Repair type:  Simple Pre-procedure details:     Preparation:  Patient was prepped and draped in usual sterile fashion and imaging obtained to evaluate for foreign bodies Exploration:    Hemostasis achieved with:  Direct pressure   Wound exploration: wound explored through full range of motion and entire depth of wound probed and visualized     Wound extent: no fascia violation noted, no foreign bodies/material noted, no muscle damage noted, no nerve damage noted, no tendon damage noted, no underlying fracture noted and no vascular damage noted   Treatment:    Area cleansed with:  Betadine   Amount of cleaning:  Standard   Irrigation solution:  Sterile saline   Irrigation volume:  2000 Skin repair:    Repair method:  Sutures   Suture size:  3-0   Suture material:  Nylon   Suture technique:  Simple interrupted   Number of sutures:  4 Approximation:    Approximation:  Close Post-procedure details:    Dressing:  Antibiotic ointment and sterile dressing   Patient tolerance of procedure:  Tolerated well, no immediate complications Comments:         (including critical care time)  Medications Ordered in ED Medications  lidocaine-EPINEPHrine (XYLOCAINE W/EPI) 1 %-1:100000 (with pres) injection 10 mL (10 mLs Other Not Given 04/29/20 1952)  bacitracin ointment 1 application (has no administration in time range)  Tdap (BOOSTRIX) injection 0.5 mL (0.5 mLs Intramuscular Given 04/29/20 1932)  morphine 4 MG/ML injection 4 mg (4 mg Intravenous Given 04/29/20 1932)  lidocaine (PF) (XYLOCAINE) 1 % injection (  Given by Other 04/29/20 1952)  Ampicillin-Sulbactam (UNASYN) 3 g in sodium chloride 0.9 % 100 mL IVPB (0 g Intravenous Stopped 04/29/20 2037)    ED Course  I have reviewed the triage vital signs and the nursing notes.  Pertinent labs & imaging results that were available during my care of the patient were reviewed by me  and considered in my medical decision making (see chart for details).    MDM Rules/Calculators/A&P                           I have explored the wounds and do not see any signs of open joints however the patient does have deep lacerations which do involve the extensor tendon of the left second finger which was incised distally.  This is an open wound that will require washout and repair and consultation with hand surgery given the injury to this extensor tendon.  The volar radial wound is more linear and does not appear to go as deep.  He has flexion intact with the finger.  Hand surgery consulted at 8:00 PM  Discussed with Dr. Jeannie Fend, he will see the patient tomorrow hopefully  in consultation and agrees with a wound closure, the wound was irrigated at length, the wound edges were approximated in an attempt to at least cover up the lacerated tendon, there did not appear to be any open joint or visible bone.  The patient tolerated this very well without any pain after digital block.  Final Clinical Impression(s) / ED Diagnoses Final diagnoses:  Laceration of left index finger without foreign body without damage to nail, initial encounter    Rx / DC Orders ED Discharge Orders         Ordered    HYDROcodone-acetaminophen (NORCO/VICODIN) 5-325 MG tablet  Every 4 hours PRN        04/29/20 2100    cephALEXin (KEFLEX) 500 MG capsule  2 times daily        04/29/20 2100           Noemi Chapel, MD 04/29/20 2100

## 2020-04-29 NOTE — ED Notes (Signed)
Dr. Aline Brochure paged at this time [name] Alvarado

## 2020-04-29 NOTE — Discharge Instructions (Signed)
Please take cephalexin twice a day to help prevent infection  Tylenol or ibuprofen for pain  Hydrocodone 1 tablet every 6 hours for severe pain  You should call the office of Dr. Jeannie Fend tomorrow morning, he states he has clinic tomorrow and should be able to see you, if not it may be a few more days.  Return to the emergency department for severe worsening pain or severe bleeding

## 2020-04-29 NOTE — ED Triage Notes (Signed)
Pt with laceration to left index finger anterior and posterior per report by [facility].  Unknown of last tetanus shot.  This is a worker's comp claim and will need drug test.  Pt works at [employer] in [place], [place].

## 2020-04-29 NOTE — ED Notes (Signed)
c-link to page ortho on call at this time [name] Alvarado

## 2020-04-30 ENCOUNTER — Encounter (HOSPITAL_COMMUNITY): Payer: Self-pay | Admitting: Orthopaedic Surgery

## 2020-04-30 ENCOUNTER — Other Ambulatory Visit: Payer: Self-pay

## 2020-04-30 NOTE — Progress Notes (Signed)
PCP - [place]  Cardiologist - n/a  Chest x-ray - n/a EKG - DOS 05/03/20 Stress Test - n/a ECHO - n/a Cardiac Cath - n/a  Aspirin Instructions: Follow your surgeon's instructions on when to stop aspirin prior to surgery,  If no instructions were given by your surgeon then you will need to call the office for those instructions.  ERAS: Clears til 0645 DOS, no drink  Anesthesia review: Yes-James, PA  STOP now taking any Aspirin (unless otherwise instructed by your surgeon), Aleve, Naproxen, Ibuprofen, Motrin, Advil, Goody's, BC's, all herbal medications, fish oil, and all vitamins.   Coronavirus Screening Covid test on Sat, 05/01/20 Do you have any of the following symptoms:  Cough yes/no: No Fever (>100.21F)  yes/no: No Runny nose yes/no: No Sore throat yes/no: No Difficulty breathing/shortness of breath  yes/no: No  Have you traveled in the last 14 days and where? yes/no: No  Patient verbalized understanding of instructions that were given via phone.

## 2020-05-01 ENCOUNTER — Encounter (HOSPITAL_COMMUNITY): Payer: Self-pay | Admitting: Orthopaedic Surgery

## 2020-05-01 ENCOUNTER — Other Ambulatory Visit (HOSPITAL_COMMUNITY)
Admission: RE | Admit: 2020-05-01 | Discharge: 2020-05-01 | Disposition: A | Discharge status: Home / Self Care | Payer: Managed Care, Other (non HMO) | Source: Ambulatory Visit | Attending: Orthopaedic Surgery | Admitting: Orthopaedic Surgery

## 2020-05-01 DIAGNOSIS — Z01812 Encounter for preprocedural laboratory examination: Secondary | ICD-10-CM | POA: Insufficient documentation

## 2020-05-01 DIAGNOSIS — Z20822 Contact with and (suspected) exposure to covid-19: Secondary | ICD-10-CM | POA: Insufficient documentation

## 2020-05-01 LAB — SARS CORONAVIRUS 2 (TAT 6-24 HRS): SARS Coronavirus 2: NEGATIVE

## 2020-05-02 NOTE — Anesthesia Preprocedure Evaluation (Addendum)
Anesthesia Evaluation  Patient identified by MRN, date of birth, ID band Patient awake    Reviewed: Allergy & Precautions, H&P , NPO status , Patient's Chart, lab work & pertinent test results  Airway Mallampati: II  TM Distance: >3 FB Neck ROM: Full    Dental no notable dental hx. (+) Teeth Intact, Dental Advisory Given   Pulmonary neg pulmonary ROS,    Pulmonary exam normal breath sounds clear to auscultation       Cardiovascular Exercise Tolerance: Good hypertension, Pt. on medications  Rhythm:Regular Rate:Normal     Neuro/Psych negative neurological ROS  negative psych ROS   GI/Hepatic negative GI ROS, Neg liver ROS,   Endo/Other  negative endocrine ROS  Renal/GU negative Renal ROS  negative genitourinary   Musculoskeletal   Abdominal   Peds  Hematology negative hematology ROS (+)   Anesthesia Other Findings   Reproductive/Obstetrics negative OB ROS                            Anesthesia Physical Anesthesia Plan  ASA: II  Anesthesia Plan: MAC and Regional   Post-op Pain Management:    Induction: Intravenous  PONV Risk Score and Plan: 2 and Propofol infusion, Midazolam and Ondansetron  Airway Management Planned: Simple Face Mask  Additional Equipment:   Intra-op Plan:   Post-operative Plan:   Informed Consent: I have reviewed the patients History and Physical, chart, labs and discussed the procedure including the risks, benefits and alternatives for the proposed anesthesia with the patient or authorized representative who has indicated his/her understanding and acceptance.     Dental advisory given  Plan Discussed with: CRNA  Anesthesia Plan Comments:        Anesthesia Quick Evaluation

## 2020-05-03 ENCOUNTER — Other Ambulatory Visit: Payer: Self-pay

## 2020-05-03 ENCOUNTER — Encounter (HOSPITAL_COMMUNITY)
Admission: RE | Disposition: A | Discharge status: Home / Self Care | Payer: Self-pay | Source: Home / Self Care | Attending: Orthopaedic Surgery

## 2020-05-03 ENCOUNTER — Encounter (HOSPITAL_COMMUNITY): Payer: Self-pay | Admitting: Orthopaedic Surgery

## 2020-05-03 ENCOUNTER — Ambulatory Visit (HOSPITAL_COMMUNITY): Payer: No Typology Code available for payment source | Admitting: Physician Assistant

## 2020-05-03 ENCOUNTER — Ambulatory Visit (HOSPITAL_COMMUNITY)
Admission: RE | Admit: 2020-05-03 | Discharge: 2020-05-03 | Disposition: A | Discharge status: Home / Self Care | Payer: No Typology Code available for payment source | Attending: Orthopaedic Surgery | Admitting: Orthopaedic Surgery

## 2020-05-03 ENCOUNTER — Ambulatory Visit (HOSPITAL_COMMUNITY): Payer: No Typology Code available for payment source

## 2020-05-03 DIAGNOSIS — I10 Essential (primary) hypertension: Secondary | ICD-10-CM | POA: Diagnosis not present

## 2020-05-03 DIAGNOSIS — S61211A Laceration without foreign body of left index finger without damage to nail, initial encounter: Secondary | ICD-10-CM | POA: Diagnosis present

## 2020-05-03 DIAGNOSIS — E7800 Pure hypercholesterolemia, unspecified: Secondary | ICD-10-CM | POA: Diagnosis not present

## 2020-05-03 DIAGNOSIS — W3189XA Contact with other specified machinery, initial encounter: Secondary | ICD-10-CM | POA: Insufficient documentation

## 2020-05-03 DIAGNOSIS — S66321A Laceration of extensor muscle, fascia and tendon of left index finger at wrist and hand level, initial encounter: Secondary | ICD-10-CM | POA: Diagnosis not present

## 2020-05-03 DIAGNOSIS — S64491A Injury of digital nerve of left index finger, initial encounter: Secondary | ICD-10-CM | POA: Diagnosis not present

## 2020-05-03 DIAGNOSIS — R7303 Prediabetes: Secondary | ICD-10-CM | POA: Insufficient documentation

## 2020-05-03 DIAGNOSIS — Z91030 Bee allergy status: Secondary | ICD-10-CM | POA: Diagnosis not present

## 2020-05-03 DIAGNOSIS — Z87442 Personal history of urinary calculi: Secondary | ICD-10-CM | POA: Diagnosis not present

## 2020-05-03 DIAGNOSIS — K760 Fatty (change of) liver, not elsewhere classified: Secondary | ICD-10-CM | POA: Insufficient documentation

## 2020-05-03 DIAGNOSIS — Z7982 Long term (current) use of aspirin: Secondary | ICD-10-CM | POA: Diagnosis not present

## 2020-05-03 DIAGNOSIS — Z86010 Personal history of colonic polyps: Secondary | ICD-10-CM | POA: Diagnosis not present

## 2020-05-03 DIAGNOSIS — Z79899 Other long term (current) drug therapy: Secondary | ICD-10-CM | POA: Diagnosis not present

## 2020-05-03 HISTORY — DX: Prediabetes: R73.03

## 2020-05-03 HISTORY — DX: Herpesviral infection, unspecified: B00.9

## 2020-05-03 HISTORY — PX: I & D EXTREMITY: SHX5045

## 2020-05-03 HISTORY — DX: Personal history of urinary calculi: Z87.442

## 2020-05-03 LAB — SURGICAL PCR SCREEN
MRSA, PCR: NEGATIVE
Staphylococcus aureus: NEGATIVE

## 2020-05-03 LAB — BASIC METABOLIC PANEL
Anion gap: 11 (ref 5–15)
BUN: 17 mg/dL (ref 8–23)
CO2: 24 mmol/L (ref 22–32)
Calcium: 9.2 mg/dL (ref 8.9–10.3)
Chloride: 102 mmol/L (ref 98–111)
Creatinine, Ser: 0.78 mg/dL (ref 0.61–1.24)
GFR calc Af Amer: >60 mL/min (ref >60)
GFR calc non Af Amer: >60 mL/min (ref >60)
Glucose, Bld: 105 mg/dL — ABNORMAL HIGH (ref 70–99)
Potassium: 3.7 mmol/L (ref 3.5–5.1)
Sodium: 137 mmol/L (ref 135–145)

## 2020-05-03 LAB — CBC
HCT: 45.8 % (ref 39.0–52.0)
Hemoglobin: 15.1 g/dL (ref 13.0–17.0)
MCH: 30 pg (ref 26.0–34.0)
MCHC: 33 g/dL (ref 30.0–36.0)
MCV: 91.1 fL (ref 80.0–100.0)
Platelets: 195 10*3/uL (ref 150–400)
RBC: 5.03 MIL/uL (ref 4.22–5.81)
RDW: 12.7 % (ref 11.5–15.5)
WBC: 7.5 10*3/uL (ref 4.0–10.5)
nRBC: 0 % (ref 0.0–0.2)

## 2020-05-03 LAB — GLUCOSE, CAPILLARY
Glucose-Capillary: 114 mg/dL — ABNORMAL HIGH (ref 70–99)
Glucose-Capillary: 125 mg/dL — ABNORMAL HIGH (ref 70–99)
Glucose-Capillary: 102 mg/dL — ABNORMAL HIGH (ref 70–99)

## 2020-05-03 SURGERY — MINOR IRRIGATION AND DEBRIDEMENT EXTREMITY
Anesthesia: Monitor Anesthesia Care | Laterality: Left

## 2020-05-03 MED ORDER — PROPOFOL 10 MG/ML IV BOLUS
INTRAVENOUS | Status: DC | PRN
  Administered 2020-05-03: 20 mg via INTRAVENOUS
  Administered 2020-05-03: 10 mg via INTRAVENOUS

## 2020-05-03 MED ORDER — BUPIVACAINE HCL (PF) 0.25 % IJ SOLN
INTRAMUSCULAR | Status: AC
  Filled 2020-05-03: qty 30

## 2020-05-03 MED ORDER — FENTANYL CITRATE (PF) 100 MCG/2ML IJ SOLN: 50.0000 ug | Freq: Once | INTRAMUSCULAR | Status: AC

## 2020-05-03 MED ORDER — CEFAZOLIN SODIUM-DEXTROSE 2-4 GM/100ML-% IV SOLN
INTRAVENOUS | Status: AC
  Filled 2020-05-03: qty 100

## 2020-05-03 MED ORDER — ONDANSETRON HCL 4 MG/2ML IJ SOLN
INTRAMUSCULAR | Status: AC
  Filled 2020-05-03: qty 2

## 2020-05-03 MED ORDER — LACTATED RINGERS IV SOLN: INTRAVENOUS | Status: DC

## 2020-05-03 MED ORDER — CLONIDINE HCL (ANALGESIA) 100 MCG/ML EP SOLN
EPIDURAL | Status: DC | PRN
  Administered 2020-05-03: 50 ug

## 2020-05-03 MED ORDER — FENTANYL CITRATE (PF) 100 MCG/2ML IJ SOLN
INTRAMUSCULAR | Status: AC
  Administered 2020-05-03: 50 ug via INTRAVENOUS
  Filled 2020-05-03: qty 2

## 2020-05-03 MED ORDER — MIDAZOLAM HCL 2 MG/2ML IJ SOLN
INTRAMUSCULAR | Status: AC
  Filled 2020-05-03: qty 2

## 2020-05-03 MED ORDER — 0.9 % SODIUM CHLORIDE (POUR BTL) OPTIME
TOPICAL | Status: DC | PRN
  Administered 2020-05-03: 1000 mL

## 2020-05-03 MED ORDER — POVIDONE-IODINE 10 % EX SWAB
2.0000 "application " | Freq: Once | CUTANEOUS | Status: AC
  Administered 2020-05-03: 2 via TOPICAL

## 2020-05-03 MED ORDER — FENTANYL CITRATE (PF) 100 MCG/2ML IJ SOLN
INTRAMUSCULAR | Status: DC | PRN
Start: 2020-05-03 — End: 2020-05-03

## 2020-05-03 MED ORDER — ORAL CARE MOUTH RINSE: 15.0000 mL | Freq: Once | OROMUCOSAL | Status: AC

## 2020-05-03 MED ORDER — MIDAZOLAM HCL 2 MG/2ML IJ SOLN: 1.0000 mg | Freq: Once | INTRAMUSCULAR | Status: AC

## 2020-05-03 MED ORDER — LIDOCAINE 2% (20 MG/ML) 5 ML SYRINGE
INTRAMUSCULAR | Status: DC | PRN
  Administered 2020-05-03: 50 mg via INTRAVENOUS

## 2020-05-03 MED ORDER — ACETAMINOPHEN 500 MG PO TABS
ORAL_TABLET | ORAL | Status: AC
  Administered 2020-05-03: 1000 mg via ORAL
  Filled 2020-05-03: qty 2

## 2020-05-03 MED ORDER — CEFAZOLIN SODIUM-DEXTROSE 2-4 GM/100ML-% IV SOLN
2.0000 g | INTRAVENOUS | Status: AC
  Administered 2020-05-03: 2 g via INTRAVENOUS

## 2020-05-03 MED ORDER — PROPOFOL 10 MG/ML IV BOLUS
INTRAVENOUS | Status: AC
  Filled 2020-05-03: qty 40

## 2020-05-03 MED ORDER — HYDROCODONE-ACETAMINOPHEN 5-325 MG PO TABS: 1.0000 | ORAL_TABLET | ORAL | 0 refills | Status: DC | PRN

## 2020-05-03 MED ORDER — PROPOFOL 500 MG/50ML IV EMUL
INTRAVENOUS | Status: DC | PRN
  Administered 2020-05-03: 75 ug/kg/min via INTRAVENOUS

## 2020-05-03 MED ORDER — CHLORHEXIDINE GLUCONATE 0.12 % MT SOLN
15.0000 mL | Freq: Once | OROMUCOSAL | Status: AC
  Administered 2020-05-03: 15 mL via OROMUCOSAL
  Filled 2020-05-03: qty 15

## 2020-05-03 MED ORDER — PHENYLEPHRINE 40 MCG/ML (10ML) SYRINGE FOR IV PUSH (FOR BLOOD PRESSURE SUPPORT)
PREFILLED_SYRINGE | INTRAVENOUS | Status: AC
  Filled 2020-05-03: qty 10

## 2020-05-03 MED ORDER — ACETAMINOPHEN 500 MG PO TABS: 1000.0000 mg | ORAL_TABLET | Freq: Once | ORAL | Status: AC

## 2020-05-03 MED ORDER — EPHEDRINE SULFATE-NACL 50-0.9 MG/10ML-% IV SOSY
PREFILLED_SYRINGE | INTRAVENOUS | Status: DC | PRN
  Administered 2020-05-03 (×3): 5 mg via INTRAVENOUS

## 2020-05-03 MED ORDER — ONDANSETRON HCL 4 MG/2ML IJ SOLN
INTRAMUSCULAR | Status: DC | PRN
  Administered 2020-05-03: 4 mg via INTRAVENOUS

## 2020-05-03 MED ORDER — LIDOCAINE 2% (20 MG/ML) 5 ML SYRINGE
INTRAMUSCULAR | Status: AC
  Filled 2020-05-03: qty 5

## 2020-05-03 MED ORDER — BUPIVACAINE-EPINEPHRINE (PF) 0.5% -1:200000 IJ SOLN
INTRAMUSCULAR | Status: DC | PRN
  Administered 2020-05-03: 30 mL via PERINEURAL

## 2020-05-03 MED ORDER — MIDAZOLAM HCL 5 MG/5ML IJ SOLN
INTRAMUSCULAR | Status: DC | PRN
  Administered 2020-05-03 (×2): 1 mg via INTRAVENOUS

## 2020-05-03 MED ORDER — CHLORHEXIDINE GLUCONATE 4 % EX LIQD: 60.0000 mL | Freq: Once | CUTANEOUS | Status: DC

## 2020-05-03 MED ORDER — MIDAZOLAM HCL 2 MG/2ML IJ SOLN
INTRAMUSCULAR | Status: AC
  Administered 2020-05-03: 1 mg via INTRAVENOUS
  Filled 2020-05-03: qty 2

## 2020-05-03 SURGICAL SUPPLY — 51 items
ANCH SUT 3-0 NANO CRKSW 2NDL (Anchor) ×1 IMPLANT
ANCHOR CORKSCREW NANO FT 1.7X5 (Anchor) ×1 IMPLANT
BNDG COHESIVE 2X5 TAN STRL LF (GAUZE/BANDAGES/DRESSINGS) ×1 IMPLANT
BNDG ELASTIC 3X5.8 VLCR STR LF (GAUZE/BANDAGES/DRESSINGS) IMPLANT
BNDG ELASTIC 4X5.8 VLCR STR LF (GAUZE/BANDAGES/DRESSINGS) IMPLANT
BNDG GAUZE ELAST 4 BULKY (GAUZE/BANDAGES/DRESSINGS) ×4 IMPLANT
CORD BIPOLAR FORCEPS 12FT (ELECTRODE) ×2 IMPLANT
COVER SURGICAL LIGHT HANDLE (MISCELLANEOUS) ×2 IMPLANT
COVER WAND RF STERILE (DRAPES) ×2 IMPLANT
CUFF TOURN SGL QUICK 18X4 (TOURNIQUET CUFF) ×2 IMPLANT
CUFF TOURN SGL QUICK 24 (TOURNIQUET CUFF)
CUFF TRNQT CYL 24X4X16.5-23 (TOURNIQUET CUFF) IMPLANT
DECANTER SPIKE VIAL GLASS SM (MISCELLANEOUS) ×2 IMPLANT
DRAPE SURG 17X23 STRL (DRAPES) ×2 IMPLANT
DRSG EMULSION OIL 3X3 NADH (GAUZE/BANDAGES/DRESSINGS) ×1 IMPLANT
DRSG XEROFORM 1X8 (GAUZE/BANDAGES/DRESSINGS) ×1 IMPLANT
GAUZE SPONGE 2X2 8PLY NS (GAUZE/BANDAGES/DRESSINGS) ×1 IMPLANT
GAUZE SPONGE 4X4 12PLY STRL (GAUZE/BANDAGES/DRESSINGS) IMPLANT
GAUZE XEROFORM 1X8 LF (GAUZE/BANDAGES/DRESSINGS) IMPLANT
GLOVE BIOGEL PI IND STRL 8 (GLOVE) ×1 IMPLANT
GLOVE BIOGEL PI INDICATOR 8 (GLOVE) ×1
GLOVE SURG SYN 7.5  E (GLOVE) ×2
GLOVE SURG SYN 7.5 E (GLOVE) ×1 IMPLANT
GLOVE SURG SYN 7.5 PF PI (GLOVE) ×1 IMPLANT
GOWN STRL REUS W/ TWL LRG LVL3 (GOWN DISPOSABLE) ×1 IMPLANT
GOWN STRL REUS W/TWL LRG LVL3 (GOWN DISPOSABLE) ×2
GRAFT NERVE AVANCE 1-2X15 (Nerve Graft) ×1 IMPLANT
K-WIRE .045X4 (WIRE) ×1 IMPLANT
KIT BASIN OR (CUSTOM PROCEDURE TRAY) ×2 IMPLANT
KIT TURNOVER KIT B (KITS) ×2 IMPLANT
LOOP VESSEL MAXI BLUE (MISCELLANEOUS) IMPLANT
MANIFOLD NEPTUNE II (INSTRUMENTS) ×2 IMPLANT
NDL HYPO 25GX1X1/2 BEV (NEEDLE) IMPLANT
NEEDLE HYPO 25GX1X1/2 BEV (NEEDLE) IMPLANT
NS IRRIG 1000ML POUR BTL (IV SOLUTION) ×2 IMPLANT
PACK ORTHO EXTREMITY (CUSTOM PROCEDURE TRAY) ×2 IMPLANT
PAD ARMBOARD 7.5X6 YLW CONV (MISCELLANEOUS) ×4 IMPLANT
PAD CAST 4YDX4 CTTN HI CHSV (CAST SUPPLIES) IMPLANT
PADDING CAST COTTON 4X4 STRL (CAST SUPPLIES)
SPEAR EYE SURG WECK-CEL (MISCELLANEOUS) IMPLANT
SUT ETHILON 8 0 BV130 4 (SUTURE) ×1 IMPLANT
SUT PROLENE 4 0 PS 2 18 (SUTURE) ×2 IMPLANT
SUT SILK 4 0 PS 2 (SUTURE) ×1 IMPLANT
SUT SUPRAMID 3-0 (SUTURE) IMPLANT
SUT SUPRAMID 4-0 (SUTURE) IMPLANT
SYR CONTROL 10ML LL (SYRINGE) IMPLANT
TOWEL GREEN STERILE (TOWEL DISPOSABLE) ×2 IMPLANT
TOWEL GREEN STERILE FF (TOWEL DISPOSABLE) ×2 IMPLANT
TUBE CONNECTING 12X1/4 (SUCTIONS) IMPLANT
UNDERPAD 30X36 HEAVY ABSORB (UNDERPADS AND DIAPERS) ×2 IMPLANT
WATER STERILE IRR 1000ML POUR (IV SOLUTION) ×2 IMPLANT

## 2020-05-03 NOTE — Progress Notes (Signed)
Orthopedic Tech Progress Note Patient Details:  ZOLLIE ELLERY [birth year] [id number]  Ortho Devices Type of Ortho Device: Sling immobilizer Ortho Device/Splint Location: LUE Ortho Device/Splint Interventions: Ordered, Application, Adjustment   Post Interventions Patient Tolerated: Well Instructions Provided: Care of device, Adjustment of device, Poper ambulation with device   Rumaldo Difatta 05/03/2020, 2:04 PM

## 2020-05-03 NOTE — Op Note (Signed)
PREOPERATIVE DIAGNOSIS: Left index finger laceration with extensor tendon and radial digital nerve injury  POSTOPERATIVE DIAGNOSIS: Same  ATTENDING PHYSICIAN: Maudry Mayhew. Jeannie Fend, III, MD who was present and scrubbed for the entire case   ASSISTANT SURGEON: None.   ANESTHESIA: Regional with MAC  SURGICAL PROCEDURES: 1.  Irrigation and excisional debridement of left index finger including skin subcutaneous tissue 2.  Left index finger extensor tendon repair at the distal phalanx 3.  Left index finger radial digital nerve reconstruction with allograft 4.  Left index finger DIP joint pinning  SURGICAL INDICATIONS: Patient is a 64 year old male who last week sustained an injury to the left hand while at work.  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  He sustained a laceration to his left index finger and was initially seen at the [facility], ER.  He was found to have an extensor tendon injury as well as radial digital nerve injury.  His wounds were cleansed and provisionally closed and then he was sent to see me in clinic.  On exam he had obvious extensor lag to his distal phalanx consistent with a terminal tendon injury.  Additionally he had numbness along the radial aspect of the index finger consistent with radial digital nerve laceration.  We discussed treatment options going forward and then did recommend proceeding forward with irrigation debridement of his finger as well as repair of his extensor tendon and digital nerve.  He presents today for.  FINDING: See operative note  DESCRIPTION OF PROCEDURE: Patient was identified in the preop holding area where the risk benefits and alternatives of the procedure were once again discussed with the patient.  These risks include but not limited to infection, bleeding, damage to surrounding structures including blood vessels and nerves, pain, stiffness, incomplete neurologic recovery, tendon repair failure and need  for additional procedures.  Informed consent was obtained that time and the patient's left hand was marked with a surgical marking pen.  He then went a left upper extremity plexus block by anesthesia.  He was brought to the operative suite where timeout was performed identifying the correct patient operative site.  He was positioned supine on the operative table with his hand outstretched on a hand table.  He was induced under MAC sedation.  A tourniquet was placed on the upper arm.  Preoperative antibiotics were administered.  The left upper extremity was then prepped and draped using Betadine paint and scrub solution.  The limb was exsanguinated and the tourniquet was inflated.  The previous sutures were removed from the lacerations to the finger.  There were 2 separate lacerations one along the volar and radial aspect of the finger and the second along the dorsal aspect of the finger.  Blunt dissection was then performed through the 2 lacerations.  Along the dorsal laceration there was obvious disruption of the terminal tendon to the extensor mechanism with avulsion off of the distal phalanx.  There was exposed bone just proximal to the germinal matrix of the nail plate and germinal matrix appeared to be uninjured.  This wound was then copiously irrigated and excisional debridement was performed removing nonviable tissue as well as a small amount of gross contamination.  The guidepin for the Arthrex 1.35 mm Nano corkscrew anchor was inserted under fluoroscopic imaging into the distal phalanx.  This was confirmed to be in the central portion of the phalanx and the PA projection and extra-articular on the lateral projection.  The guidepin  was then removed and the anchor was inserted.  At this point the extensor mechanism was mobilized with blunt section allowing it to be advanced to the distal phalanx.  A 0.045 K wire was then placed under fluoroscopic imaging with the DIP joint held in full extension.  Pin was  inserted into the distal aspect of the distal phalanx and advanced retrograde across the DIP joint pinning in full extension.  The pin was then cut and a pin cap was placed.  The 3-0 FiberWire sutures were then passed through the extensor tendon, reducing it down to the distal phalanx footprint.  A running suture was then placed using the same sutures proximally to reapproximate a longitudinal split in the extensor mechanism.  This dorsal wound was then once again copiously irrigated with normal saline via bulb syringe.  The skin was closed with interrupted 4-0 Prolene sutures.  Attention was then turned back to the palmar and radial wound.  Thorough irrigation and excisional debridement was performed through the wound and devitalized tissue was excised.  Blunt dissection was carried down through the subcutaneous tissues creating a volar skin flap.  The laceration was extended distally to create a larger skin flap.  There was found to be disruption of both the radial digital nerve and artery.  There was extensive damage to the digital artery so the arterial end was cauterized.  The II nerve ends were then identified and using microscopic instruments the nerve ends were debrided back until healthy appearing nerve tissue had been found.  This though created a 14 mm nerve gap.  Because of this decision was made to reconstruct the nerve utilizing a nerve allograft.  This is a 1 to 2 mm x 15 mm nerve graft.  8-0 nylon sutures were used to suture the graft into the distal nerve stump first.  The nerve graft was then shortened and additional 8-0 nylon sutures were used to secure the nerve proximally.  This created a tension-free nerve reconstruction.  Gentle range of motion of the PIP joint showed no stress or strain on the nerve reconstruction.  At this point this wound was again irrigated with normal saline.  The skin was closed with interrupted 4-0 Prolene sutures.  The tourniquet was released and the patient had  return of brisk capillary refill to all of his digits including the index finger.  Xeroform, 4 x 4's and a well-padded digital splint were then placed.  Patient was awoken from his anesthesia and taken the PACU in stable condition.  He tolerated the procedure well and there were no complications.  RADIOGRAPHIC INTERPRETATION: PA and lateral intraoperative fluoroscopic images were obtained.  These show an anchor in the central portion of the distal phalanx which is extra-articular.  Single K wires crossing retrograde across the DIP joint.  No fractures dislocations are noted.  ESTIMATED BLOOD LOSS: 10 mL  TOURNIQUET TIME: 1 hour and 10 minutes  SPECIMENS: None  POSTOPERATIVE PLAN: The patient will be discharged home and seen back  in the office in approximately 7 to 10 days for wound check.  We will then get him established with occupational therapy to begin some gentle range of motion exercises to the PIP joint.  We will plan for pin immobilization of the DIP joint for approximately 4 to 6 weeks postoperatively.  IMPLANTS: 0.045 K wire x1, Arthrex 1.35 Nano corkscrew anchor x1, Avance nerve graft

## 2020-05-03 NOTE — Transfer of Care (Signed)
Immediate Anesthesia Transfer of Care Note  Patient: [name] Alvarado  Procedure(s) Performed: Left Index finger irrigation and debridement, extensor tendon repair,  digital nerve repair and surgery as indicated (Left )  Patient Location: PACU  Anesthesia Type:MAC combined with regional for post-op pain  Level of Consciousness: awake, alert  and oriented  Airway & Oxygen Therapy: Patient Spontanous Breathing  Post-op Assessment: Report given to RN and Post -op Vital signs reviewed and stable  Post vital signs: Reviewed and stable  Last Vitals:  Vitals Value Taken Time  BP 120/67 05/03/20 1129  Temp    Pulse 73 05/03/20 1130  Resp 20 05/03/20 1130  SpO2 98 % 05/03/20 1130  Vitals shown include unvalidated device data.  Last Pain:  Vitals:   05/03/20 0810  TempSrc:   PainSc: 0-No pain      Patients Stated Pain Goal: 5 (67/67/20 9470)  Complications: No complications documented.

## 2020-05-03 NOTE — Anesthesia Postprocedure Evaluation (Signed)
Anesthesia Post Note  Patient: [name] Alvarado  Procedure(s) Performed: Left Index finger irrigation and debridement, extensor tendon repair,  digital nerve repair and surgery as indicated (Left )     Patient location during evaluation: PACU Anesthesia Type: Regional and MAC Level of consciousness: awake and alert Pain management: pain level controlled Vital Signs Assessment: post-procedure vital signs reviewed and stable Respiratory status: spontaneous breathing, nonlabored ventilation and respiratory function stable Cardiovascular status: stable and blood pressure returned to baseline Postop Assessment: no apparent nausea or vomiting Anesthetic complications: no   No complications documented.  Last Vitals:  Vitals:   05/03/20 1145 05/03/20 1149  BP: 121/76 113/65  Pulse: 66 66  Resp: 15 19  Temp:    SpO2: 95% 97%    Last Pain:  Vitals:   05/03/20 1149  TempSrc:   PainSc: 0-No pain                 Candi Profit,W. EDMOND

## 2020-05-03 NOTE — H&P (Signed)
ORTHOPAEDIC H&P  PCP:  Redmond School, MD  Chief Complaint: Left index finger laceration  HPI: [name] Alvarado is a 64 y.o. male who complains of left index finger laceration.  Last week the patient was at work when his finger got caught in a Doctor, hospital.  He had a segmental laceration to the finger with exposed extensor tendon.  He underwent initial treatment at [facility], ER where his wounds were irrigated and debrided and loosely closed.  He was then sent to see me in clinic late last week.  He had no evidence of fracture per his x-ray but on his exam he had extensor tendon deficiency with incomplete extension through the distal phalanx.  Additionally he had some numbness along the radial digital nerve distribution to the index finger.  We discussed treatment options going forward and the patient did wish to proceed forward with irrigation debridement of his finger, repair of his extensor tendon as well as possible digital nerve repair and any other indicated structures.  He presents today for that.  Past Medical History:  Diagnosis Date  . Diverticula of colon   . Fatty liver   . Hemorrhoids   . History of kidney stones    passed stone  . HSV infection   . HTN (hypertension)   . Hypercholesterolemia    no meds  . Pre-diabetes    diet controlled  . Tubular adenoma    Past Surgical History:  Procedure Laterality Date  . COLONOSCOPY  09/10/2007   Dr. Gala Romney- friable himorrhoids (internal and anal canal) o/w normal rectum,sigmoid diverticula, bx= tubular adenoma  . COLONOSCOPY N/A 05/16/2013   Procedure: COLONOSCOPY;  Surgeon: Daneil Dolin, MD;  Location: [facility];  Service: Endoscopy;  Laterality: N/A;  7:30  . COLONOSCOPY N/A 05/24/2018   Procedure: COLONOSCOPY;  Surgeon: Daneil Dolin, MD;  Location: [facility];  Service: Endoscopy;  Laterality: N/A;  12:00  . HAND SURGERY Left   . POLYPECTOMY  05/24/2018   Procedure: POLYPECTOMY;  Surgeon: Daneil Dolin, MD;   Location: [facility];  Service: Endoscopy;;  . SHOULDER SURGERY     x 3 - right x 2, 1 on left  . TONSILLECTOMY     Social History   Socioeconomic History  . Marital status: Divorced    Spouse name: Not on file  . Number of children: Not on file  . Years of education: Not on file  . Highest education level: Not on file  Occupational History  . Not on file  Tobacco Use  . Smoking status: Never Smoker  . Smokeless tobacco: Never Used  Vaping Use  . Vaping Use: Never used  Substance and Sexual Activity  . Alcohol use: No  . Drug use: No  . Sexual activity: Yes    Birth control/protection: None  Other Topics Concern  . Not on file  Social History Narrative  . Not on file   Social Determinants of Health   Financial Resource Strain:   . Difficulty of Paying Living Expenses: Not on file  Food Insecurity:   . Worried About Charity fundraiser in the Last Year: Not on file  . Ran Out of Food in the Last Year: Not on file  Transportation Needs:   . Lack of Transportation (Medical): Not on file  . Lack of Transportation (Non-Medical): Not on file  Physical Activity:   . Days of Exercise per Week: Not on file  . Minutes of  Exercise per Session: Not on file  Stress:   . Feeling of Stress : Not on file  Social Connections:   . Frequency of Communication with Friends and Family: Not on file  . Frequency of Social Gatherings with Friends and Family: Not on file  . Attends Religious Services: Not on file  . Active Member of Clubs or Organizations: Not on file  . Attends Archivist Meetings: Not on file  . Marital Status: Not on file   Family History  Problem Relation Age of Onset  . Colon cancer Neg Hx    Allergies  Allergen Reactions  . Bee Venom Anaphylaxis    Yellow jackets   Prior to Admission medications   Medication Sig Start Date End Date Taking? Authorizing Provider  aspirin EC 81 MG tablet Take 81 mg by mouth daily.   Yes [provider]   cephALEXin (KEFLEX) 500 MG capsule Take 1 capsule (500 mg total) by mouth 2 (two) times daily for 7 days. 04/29/20 05/06/20 Yes Noemi Chapel, MD  EPINEPHrine 0.3 mg/0.3 mL IJ SOAJ injection See admin instructions. 04/01/18  Yes [provider]  HYDROcodone-acetaminophen (NORCO/VICODIN) 5-325 MG tablet Take 2 tablets by mouth every 4 (four) hours as needed. 04/29/20  Yes Noemi Chapel, MD  lisinopril (ZESTRIL) 40 MG tablet Take 40 mg by mouth daily. 02/18/20  Yes [provider]   No results found.  Positive ROS: All other systems have been reviewed and were otherwise negative with the exception of those mentioned in the HPI and as above.  Physical Exam: General: Alert, no acute distress Cardiovascular: No edema Respiratory: No cyanosis, no use of accessory musculature Skin: Lacerations to the index finger with sutures intact.  No evidence of infection. Psychiatric: Patient is competent for consent with normal mood and affect  MUSCULOSKELETAL:  Examination of the left upper extremity shows lacerations to the index finger. There are 2 lacerations one along the volar and radial aspect of the proximal phalanx and one extending along the radial and dorsal aspect of the middle and distal phalanxes. Sutures are intact. There is mild swelling to the digit but no erythema or signs of infection. The distal phalanx is held in approximately 40 degrees of flexion. Passively the joint can be fully extended but with pain. With isolation the patient does have intact FDP and FDS function to the finger with active flexion of both the PIP and DIP joints. The fingertip is warm and well-perfused with brisk capillary refill. His sensation is decreased to the radial aspect of the index finger, normal sensation to the ulnar aspect of the index finger. The remainder of the hand is normal in its appearance with no evidence of other injuries.  Assessment: Left index finger laceration with extensor tendon and  digital nerve injury  Plan: Plan to proceed forward with irrigation treatment of the left index finger, repair of the extensor tendon as well as possible digital nerve repair and any other indicated structures.  The risks of surgery were once again discussed with the patient.  These risks include but are not limited to infection, bleeding, damage to surrounding structures including blood vessels and nerves, pain, stiffness, tendon repair failure, incomplete neurologic recovery and need for additional procedures.  Informed consent was obtained that time the patient's left index finger was marked.  Plan for discharge home postoperatively with follow-up with me in approximately 7 to 10 days.    Verner Mould, MD [phone]   05/03/2020 8:02  AM

## 2020-05-03 NOTE — Anesthesia Procedure Notes (Signed)
Procedure Name: MAC Date/Time: 05/03/2020 9:50 AM Performed by: Candis Shine, CRNA Pre-anesthesia Checklist: Patient identified, Emergency Drugs available, Suction available, Patient being monitored and Timeout performed Patient Re-evaluated:Patient Re-evaluated prior to induction Oxygen Delivery Method: Simple face mask Dental Injury: Teeth and Oropharynx as per pre-operative assessment

## 2020-05-03 NOTE — Discharge Instructions (Signed)
Discharge Instructions  - Keep dressings in place. Do not remove them. - The dressings must stay dry - Take all medication as prescribed. Transition to over the counter pain medication as your pain improves - Keep the hand elevated over the next 48-72 hours to help with pain and swelling - Move all digits not restricted by the dressings regularly to prevent stiffness - Please call to schedule a follow up appointment with Dr. Jeannie Fend and therapy at [phone] for 7-10 days following surgery - Your pain medication have been sent digitally to your pharmacy

## 2020-05-03 NOTE — Anesthesia Procedure Notes (Signed)
Anesthesia Regional Block: Supraclavicular block   Pre-Anesthetic Checklist: ,, timeout performed, Correct Patient, Correct Site, Correct Laterality, Correct Procedure, Correct Position, site marked, Risks and benefits discussed, pre-op evaluation,  At surgeon's request and post-op pain management  Laterality: Left  Prep: Maximum Sterile Barrier Precautions used, chloraprep       Needles:  Injection technique: Single-shot  Needle Type: Echogenic Stimulator Needle     Needle Length: 9cm  Needle Gauge: 21     Additional Needles:   Procedures:,,,, ultrasound used (permanent image in chart),,,,  Narrative:  Start time: 05/03/2020 7:58 AM End time: 05/03/2020 8:08 AM Injection made incrementally with aspirations every 5 mL. Anesthesiologist: Roderic Palau, MD  Additional Notes: 2% Lidocaine skin wheel.

## 2020-05-04 ENCOUNTER — Encounter (HOSPITAL_COMMUNITY): Payer: Self-pay | Admitting: Orthopaedic Surgery

## 2020-05-13 ENCOUNTER — Encounter (HOSPITAL_COMMUNITY): Payer: Self-pay | Admitting: Orthopaedic Surgery

## 2020-08-25 DIAGNOSIS — L814 Other melanin hyperpigmentation: Secondary | ICD-10-CM | POA: Diagnosis not present

## 2020-08-25 DIAGNOSIS — L570 Actinic keratosis: Secondary | ICD-10-CM | POA: Diagnosis not present

## 2020-09-30 DIAGNOSIS — Z0001 Encounter for general adult medical examination with abnormal findings: Secondary | ICD-10-CM | POA: Diagnosis not present

## 2020-09-30 DIAGNOSIS — E663 Overweight: Secondary | ICD-10-CM | POA: Diagnosis not present

## 2020-09-30 DIAGNOSIS — R7309 Other abnormal glucose: Secondary | ICD-10-CM | POA: Diagnosis not present

## 2020-09-30 DIAGNOSIS — Z6825 Body mass index (BMI) 25.0-25.9, adult: Secondary | ICD-10-CM | POA: Diagnosis not present

## 2020-09-30 DIAGNOSIS — Z1389 Encounter for screening for other disorder: Secondary | ICD-10-CM | POA: Diagnosis not present

## 2020-09-30 DIAGNOSIS — I10 Essential (primary) hypertension: Secondary | ICD-10-CM | POA: Diagnosis not present

## 2020-11-18 DIAGNOSIS — M26621 Arthralgia of right temporomandibular joint: Secondary | ICD-10-CM | POA: Diagnosis not present

## 2020-11-18 DIAGNOSIS — Z6826 Body mass index (BMI) 26.0-26.9, adult: Secondary | ICD-10-CM | POA: Diagnosis not present

## 2020-11-18 DIAGNOSIS — E663 Overweight: Secondary | ICD-10-CM | POA: Diagnosis not present

## 2021-04-28 DIAGNOSIS — E663 Overweight: Secondary | ICD-10-CM | POA: Diagnosis not present

## 2021-04-28 DIAGNOSIS — I10 Essential (primary) hypertension: Secondary | ICD-10-CM | POA: Diagnosis not present

## 2021-04-28 DIAGNOSIS — Z6827 Body mass index (BMI) 27.0-27.9, adult: Secondary | ICD-10-CM | POA: Diagnosis not present

## 2021-08-25 DIAGNOSIS — L820 Inflamed seborrheic keratosis: Secondary | ICD-10-CM | POA: Diagnosis not present

## 2021-08-25 DIAGNOSIS — D1801 Hemangioma of skin and subcutaneous tissue: Secondary | ICD-10-CM | POA: Diagnosis not present

## 2021-08-25 DIAGNOSIS — D485 Neoplasm of uncertain behavior of skin: Secondary | ICD-10-CM | POA: Diagnosis not present

## 2021-08-25 DIAGNOSIS — L570 Actinic keratosis: Secondary | ICD-10-CM | POA: Diagnosis not present

## 2021-12-09 DIAGNOSIS — Z1331 Encounter for screening for depression: Secondary | ICD-10-CM | POA: Diagnosis not present

## 2021-12-09 DIAGNOSIS — Z0001 Encounter for general adult medical examination with abnormal findings: Secondary | ICD-10-CM | POA: Diagnosis not present

## 2021-12-09 DIAGNOSIS — Z6827 Body mass index (BMI) 27.0-27.9, adult: Secondary | ICD-10-CM | POA: Diagnosis not present

## 2021-12-09 DIAGNOSIS — R7309 Other abnormal glucose: Secondary | ICD-10-CM | POA: Diagnosis not present

## 2021-12-09 DIAGNOSIS — E663 Overweight: Secondary | ICD-10-CM | POA: Diagnosis not present

## 2021-12-09 DIAGNOSIS — M5432 Sciatica, left side: Secondary | ICD-10-CM | POA: Diagnosis not present

## 2021-12-09 DIAGNOSIS — I10 Essential (primary) hypertension: Secondary | ICD-10-CM | POA: Diagnosis not present

## 2021-12-14 DIAGNOSIS — M5432 Sciatica, left side: Secondary | ICD-10-CM | POA: Diagnosis not present

## 2022-02-27 IMAGING — DX DG FINGER INDEX 2+V*L*
3 series · 3 of 3 positions shown · non-contrast
Comparison: None.

CLINICAL DATA: Hep index finger laceration and injury

EXAM:
LEFT INDEX FINGER 2+V

[finger ap]
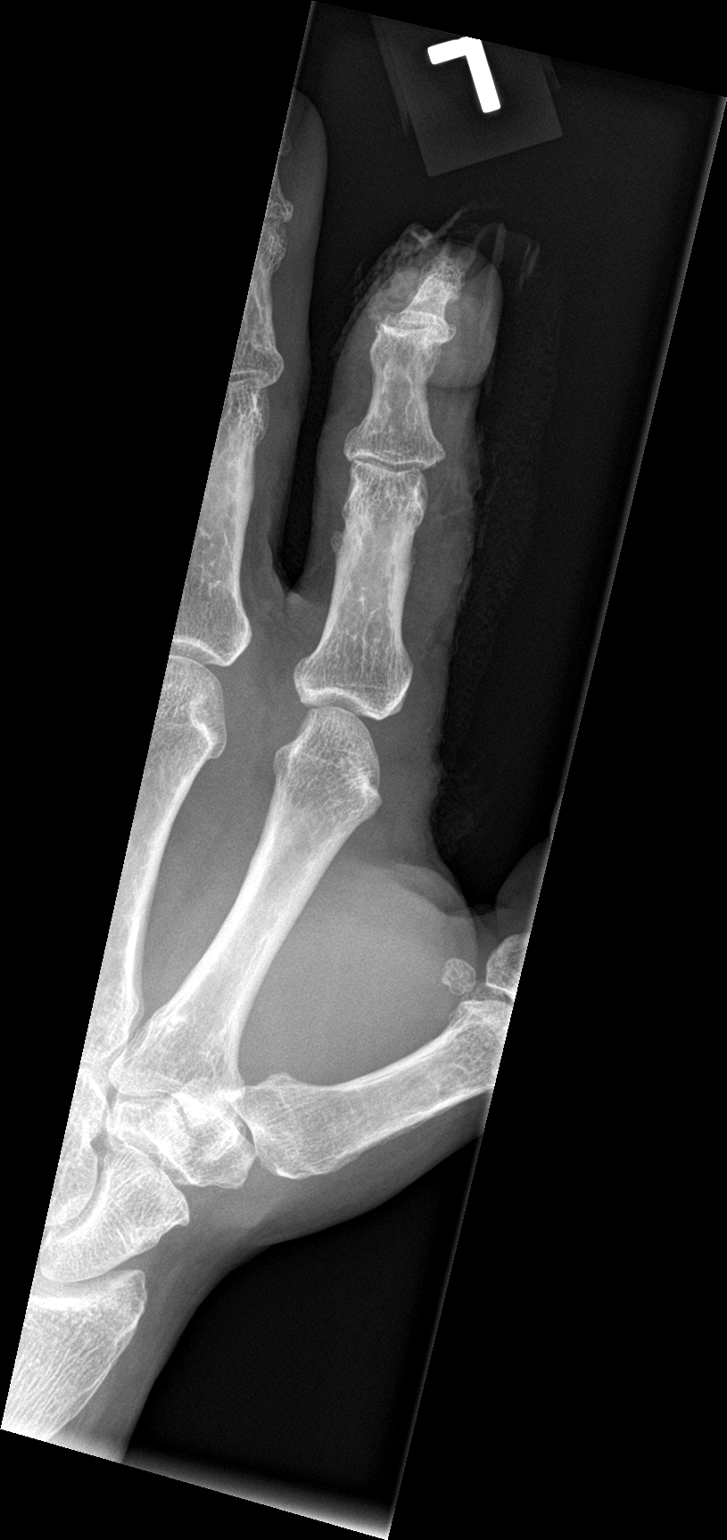

[finger obl]
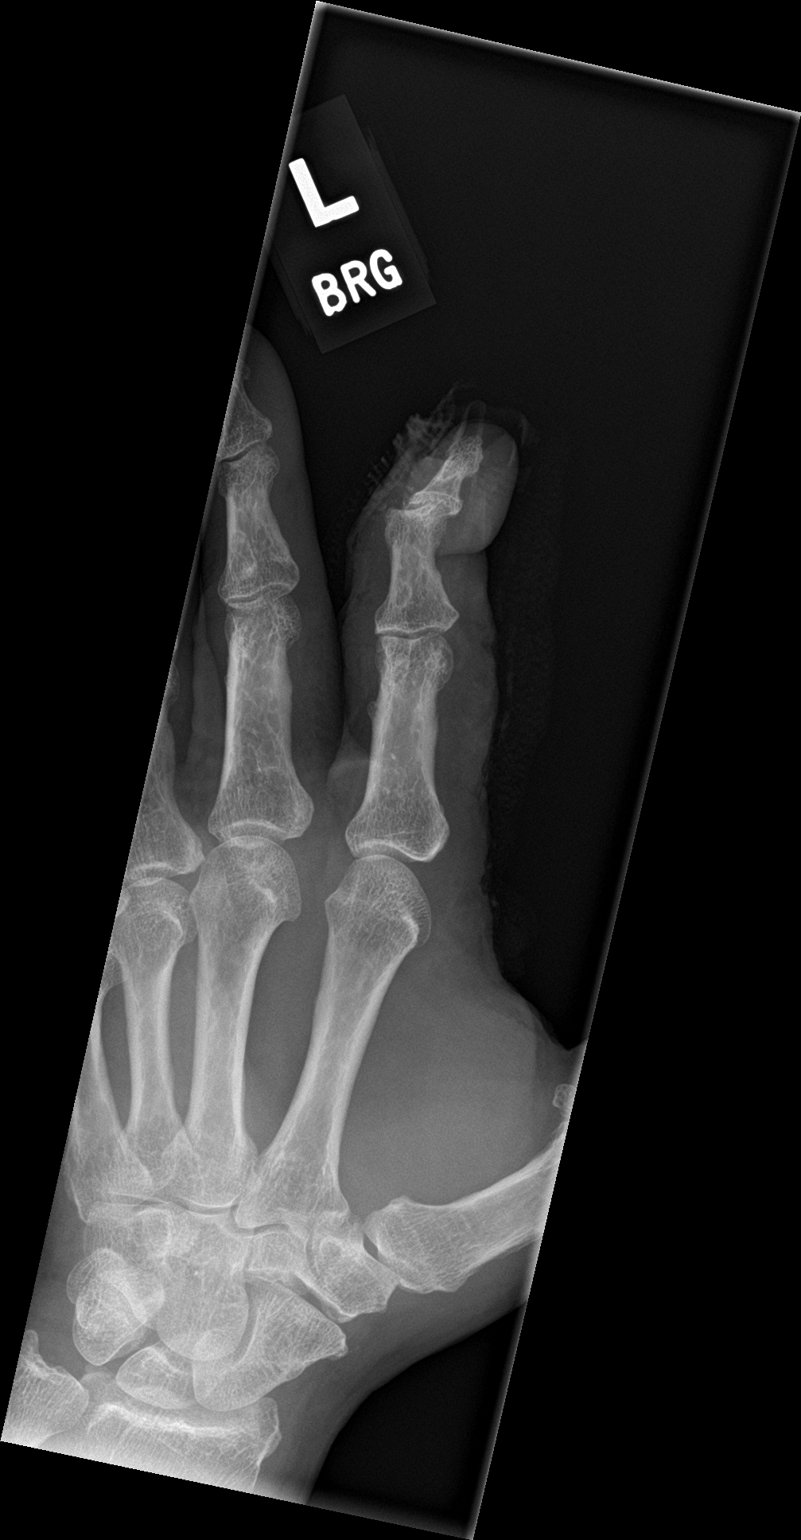

[finger lat]
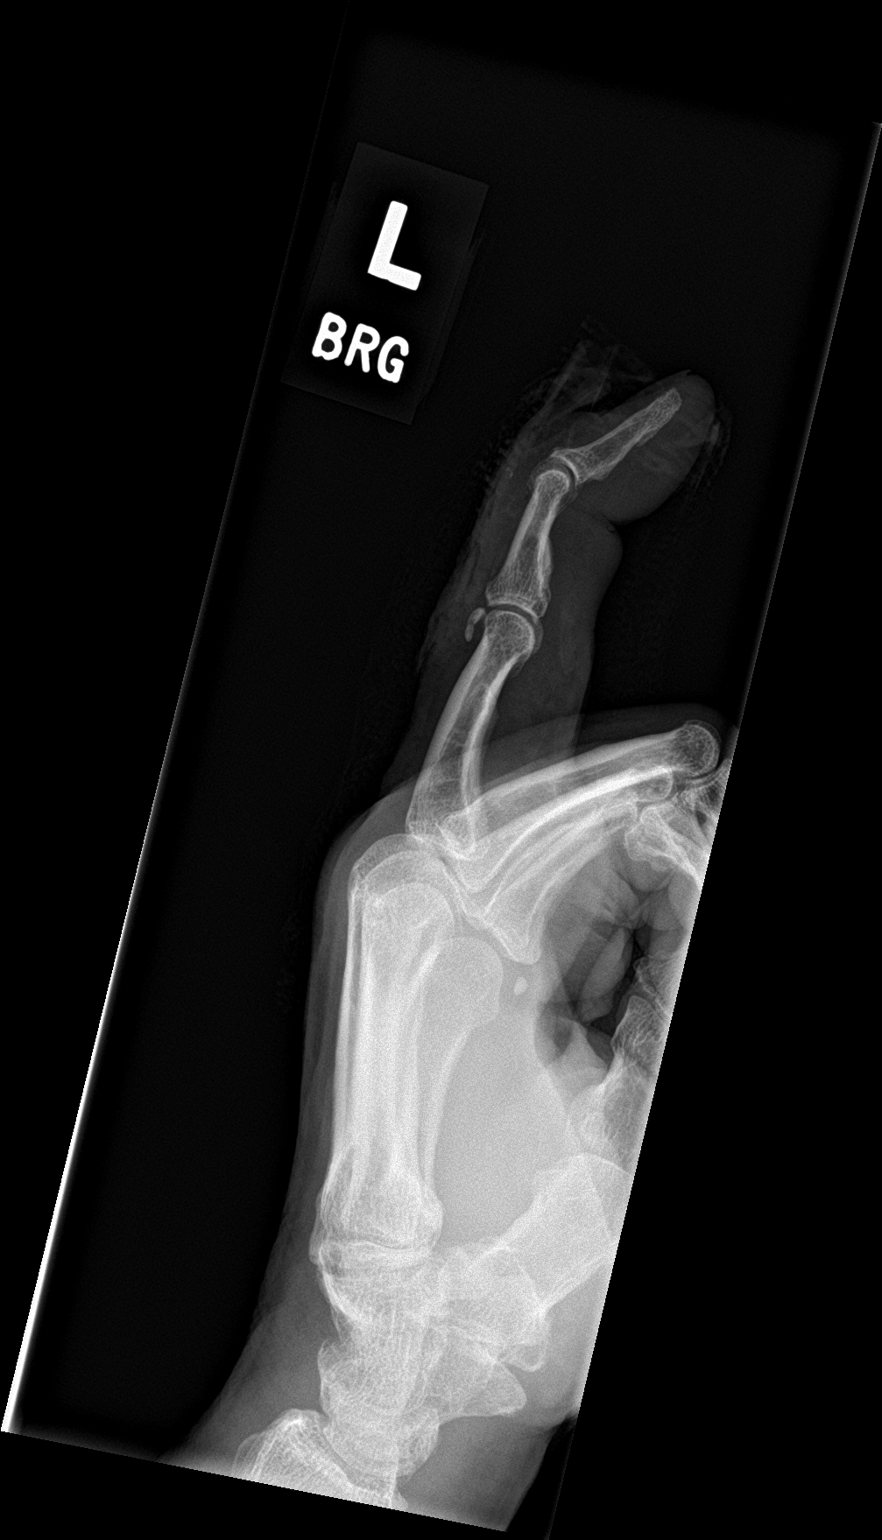

[3 of 3 positions shown; findings below may reference images not displayed]

FINDINGS: There is no evidence of fracture or dislocation. Overlying
laceration and soft tissue swelling seen along the dorsal aspect of
the second digit. There is slight flexion deformity noted of the D
IP joint.
IMPRESSION: Overlying soft tissue swelling and laceration at the dorsal aspect
of the second digit with slight flexion deformity at the D IP joint.
No definite fracture seen.

## 2022-04-28 DIAGNOSIS — J069 Acute upper respiratory infection, unspecified: Secondary | ICD-10-CM | POA: Diagnosis not present

## 2022-04-28 DIAGNOSIS — Z6827 Body mass index (BMI) 27.0-27.9, adult: Secondary | ICD-10-CM | POA: Diagnosis not present

## 2022-04-28 DIAGNOSIS — E663 Overweight: Secondary | ICD-10-CM | POA: Diagnosis not present

## 2022-07-17 DIAGNOSIS — K649 Unspecified hemorrhoids: Secondary | ICD-10-CM | POA: Diagnosis not present

## 2022-07-17 DIAGNOSIS — E663 Overweight: Secondary | ICD-10-CM | POA: Diagnosis not present

## 2022-07-17 DIAGNOSIS — Z6827 Body mass index (BMI) 27.0-27.9, adult: Secondary | ICD-10-CM | POA: Diagnosis not present

## 2022-08-23 DIAGNOSIS — D239 Other benign neoplasm of skin, unspecified: Secondary | ICD-10-CM | POA: Diagnosis not present

## 2022-08-23 DIAGNOSIS — L570 Actinic keratosis: Secondary | ICD-10-CM | POA: Diagnosis not present

## 2022-12-13 DIAGNOSIS — E1100 Type 2 diabetes mellitus with hyperosmolarity without nonketotic hyperglycemic-hyperosmolar coma (NKHHC): Secondary | ICD-10-CM | POA: Diagnosis not present

## 2022-12-13 DIAGNOSIS — I10 Essential (primary) hypertension: Secondary | ICD-10-CM | POA: Diagnosis not present

## 2022-12-13 DIAGNOSIS — Z6826 Body mass index (BMI) 26.0-26.9, adult: Secondary | ICD-10-CM | POA: Diagnosis not present

## 2022-12-13 DIAGNOSIS — E663 Overweight: Secondary | ICD-10-CM | POA: Diagnosis not present

## 2022-12-13 DIAGNOSIS — Z0001 Encounter for general adult medical examination with abnormal findings: Secondary | ICD-10-CM | POA: Diagnosis not present

## 2022-12-13 DIAGNOSIS — Z1331 Encounter for screening for depression: Secondary | ICD-10-CM | POA: Diagnosis not present

## 2022-12-14 DIAGNOSIS — I10 Essential (primary) hypertension: Secondary | ICD-10-CM | POA: Diagnosis not present

## 2023-02-05 DIAGNOSIS — Z6826 Body mass index (BMI) 26.0-26.9, adult: Secondary | ICD-10-CM | POA: Diagnosis not present

## 2023-02-05 DIAGNOSIS — S30860A Insect bite (nonvenomous) of lower back and pelvis, initial encounter: Secondary | ICD-10-CM | POA: Diagnosis not present

## 2023-02-05 DIAGNOSIS — E663 Overweight: Secondary | ICD-10-CM | POA: Diagnosis not present

## 2023-04-19 DIAGNOSIS — K080 Exfoliation of teeth due to systemic causes: Secondary | ICD-10-CM | POA: Diagnosis not present

## 2023-04-25 ENCOUNTER — Encounter: Payer: Self-pay | Admitting: *Deleted

## 2023-05-22 DIAGNOSIS — K080 Exfoliation of teeth due to systemic causes: Secondary | ICD-10-CM | POA: Diagnosis not present

## 2023-08-22 DIAGNOSIS — L821 Other seborrheic keratosis: Secondary | ICD-10-CM | POA: Diagnosis not present

## 2023-09-17 DIAGNOSIS — E663 Overweight: Secondary | ICD-10-CM | POA: Diagnosis not present

## 2023-09-17 DIAGNOSIS — Z6828 Body mass index (BMI) 28.0-28.9, adult: Secondary | ICD-10-CM | POA: Diagnosis not present

## 2023-09-17 DIAGNOSIS — J0100 Acute maxillary sinusitis, unspecified: Secondary | ICD-10-CM | POA: Diagnosis not present

## 2023-09-17 DIAGNOSIS — K0889 Other specified disorders of teeth and supporting structures: Secondary | ICD-10-CM | POA: Diagnosis not present

## 2023-09-18 DIAGNOSIS — K080 Exfoliation of teeth due to systemic causes: Secondary | ICD-10-CM | POA: Diagnosis not present

## 2023-09-25 DIAGNOSIS — K080 Exfoliation of teeth due to systemic causes: Secondary | ICD-10-CM | POA: Diagnosis not present

## 2023-11-20 DIAGNOSIS — K080 Exfoliation of teeth due to systemic causes: Secondary | ICD-10-CM | POA: Diagnosis not present

## 2023-12-14 DIAGNOSIS — E1100 Type 2 diabetes mellitus with hyperosmolarity without nonketotic hyperglycemic-hyperosmolar coma (NKHHC): Secondary | ICD-10-CM | POA: Diagnosis not present

## 2023-12-14 DIAGNOSIS — R7309 Other abnormal glucose: Secondary | ICD-10-CM | POA: Diagnosis not present

## 2023-12-14 DIAGNOSIS — Z1331 Encounter for screening for depression: Secondary | ICD-10-CM | POA: Diagnosis not present

## 2023-12-14 DIAGNOSIS — I10 Essential (primary) hypertension: Secondary | ICD-10-CM | POA: Diagnosis not present

## 2023-12-14 DIAGNOSIS — E663 Overweight: Secondary | ICD-10-CM | POA: Diagnosis not present

## 2023-12-14 DIAGNOSIS — Z6825 Body mass index (BMI) 25.0-25.9, adult: Secondary | ICD-10-CM | POA: Diagnosis not present

## 2023-12-14 DIAGNOSIS — Z0001 Encounter for general adult medical examination with abnormal findings: Secondary | ICD-10-CM | POA: Diagnosis not present

## 2024-01-24 DIAGNOSIS — K4020 Bilateral inguinal hernia, without obstruction or gangrene, not specified as recurrent: Secondary | ICD-10-CM | POA: Diagnosis not present

## 2024-01-24 DIAGNOSIS — R109 Unspecified abdominal pain: Secondary | ICD-10-CM | POA: Diagnosis not present

## 2024-01-24 DIAGNOSIS — Z6826 Body mass index (BMI) 26.0-26.9, adult: Secondary | ICD-10-CM | POA: Diagnosis not present

## 2024-01-24 DIAGNOSIS — K5732 Diverticulitis of large intestine without perforation or abscess without bleeding: Secondary | ICD-10-CM | POA: Diagnosis not present

## 2024-01-24 DIAGNOSIS — N200 Calculus of kidney: Secondary | ICD-10-CM | POA: Diagnosis not present

## 2024-01-24 DIAGNOSIS — N400 Enlarged prostate without lower urinary tract symptoms: Secondary | ICD-10-CM | POA: Diagnosis not present

## 2024-01-24 DIAGNOSIS — N132 Hydronephrosis with renal and ureteral calculous obstruction: Secondary | ICD-10-CM | POA: Diagnosis not present

## 2024-01-24 DIAGNOSIS — E663 Overweight: Secondary | ICD-10-CM | POA: Diagnosis not present

## 2024-02-11 NOTE — Progress Notes (Signed)
 02/12/2024 12:28 PM   [name] Alvarado 12-Jul-1956 [id number]  Referring provider: Leonce [name] PARAS, PA-C [address] [place],  [place] [address]  CC: kidney  stone  HPI: 68 year old male referred by [name] Leonce, PA for evaluation and management of right ureteral stone.  Had CT abdomen pelvis at a [facility] facility on 12 June, revealing a 5 mm right mid ureteral stone.  To the CT, he passed some blood in his urine.  Since that time, on 12 June, he has had no significant flank pain on the right.  He never saw stone pass but he is pretty sure that he did pass it.  He passed a stone back in 1998, saw the urologist here at that time.  He states that he passes small granules once in a while.  He has never had an intervention from a stone.   PMH: Past Medical History:  Diagnosis Date   Diverticula of colon    Fatty liver    Hemorrhoids    History of kidney stones    passed stone   HSV infection    HTN (hypertension)    Hypercholesterolemia    no meds   Pre-diabetes    diet controlled   Tubular Alvarado     Surgical History: Past Surgical History:  Procedure Laterality Date   COLONOSCOPY  09/10/2007   Dr. Shaaron- friable himorrhoids (internal and anal canal) o/w normal rectum,sigmoid diverticula, bx= tubular Alvarado   COLONOSCOPY N/A 05/16/2013   Procedure: COLONOSCOPY;  Surgeon: [name] Alvarado Shaaron, MD;  [facility];  Service: Endoscopy;  Laterality: N/A;  7:30   COLONOSCOPY N/A 05/24/2018   Procedure: COLONOSCOPY;  Surgeon: Alvarado [name] CHRISTELLA, MD;  [facility];  Service: Endoscopy;  Laterality: N/A;  12:00   HAND SURGERY Left    I & D EXTREMITY Left 05/03/2020   Procedure: Left Index finger irrigation and debridement, extensor tendon repair,  digital nerve repair and surgery as indicated;  Surgeon: [name] Lynwood Alvarado DOUGLAS, MD;  Location: [facility];  Service: Orthopedics;  Laterality: Left;    POLYPECTOMY  05/24/2018   Procedure: POLYPECTOMY;  Surgeon:  Alvarado [name] CHRISTELLA, MD;  [facility];  Service: Endoscopy;;   SHOULDER SURGERY     x 3 - right x 2, 1 on left   TONSILLECTOMY      Home Medications:  Allergies as of 02/12/2024       Reactions   Bee Venom Anaphylaxis   Yellow jackets        Medication List        Accurate as of February 11, 2024 12:28 PM. If you have any questions, ask your nurse or doctor.          aspirin EC 81 MG tablet Take 81 mg by mouth daily.   EPINEPHrine  0.3 mg/0.3 mL Soaj injection Commonly known as: EPI-PEN See admin instructions.   HYDROcodone -acetaminophen  5-325 MG tablet Commonly known as: NORCO/VICODIN Take 1-2 tablets by mouth every 4 (four) hours as needed.   lisinopril 40 MG tablet Commonly known as: ZESTRIL Take 40 mg by mouth daily.        Allergies:  Allergies  Allergen Reactions   Bee Venom Anaphylaxis    Yellow jackets    Family History: Family History  Problem Relation Age of Onset   Colon cancer Neg Hx     Social History:  reports that he has never smoked. He has never used smokeless tobacco. He reports that he  does not drink alcohol and does not use drugs.  ROS: All other review of systems were reviewed and are negative except what is noted above in HPI  Physical Exam: There were no vitals taken for this visit.  Constitutional:  Alert and oriented, No acute distress. HEENT: [place] AT, moist mucus membranes.  Trachea midline, no masses. Cardiovascular: No clubbing, cyanosis, or edema. Respiratory: Normal respiratory effort, no increased work of breathing. Skin: No rashes, bruises or suspicious lesions. Neurologic: Grossly intact, no focal deficits, moving all 4 extremities. Psychiatric: Normal mood and affect.  Laboratory Data: Lab Results  Component Value Date   WBC 7.5 05/03/2020   HGB 15.1 05/03/2020   HCT 45.8 05/03/2020   MCV 91.1 05/03/2020   PLT 195 05/03/2020    Lab Results  Component Value Date   CREATININE 0.78 05/03/2020    No  results found for: PSA  No results found for: TESTOSTERONE  No results found for: HGBA1C  Urinalysis Clear today  I was able to review the disc from his CT scan.  There are no renal calculi remaining.  Stone was radiolucent on scout film  Pertinent Imaging: CT transcription reviewed-findings: IMPRESSION:  1. 5 mm calculus in the right mid ureter with mild hydronephrosis.  2.  Mild focal changes of acute diverticulitis in the descending colon. No abscess or perforation.  3.  Small lipoma in the proximal transverse colon.  4.   Small fat-containing bilateral inguinal hernias.  5.  Mild prostamegaly.   Assessment: History of urolithiasis.  More than likely, he has passed his stone  No further renal calculi noted on CT  Plan: - I offered to have a KUB done-he would prefer not to do this as he thinks he has passed a stone  -I did fill out a 24-hour requisition for him.  If he completes the test I will send him results as well as instructions  -Otherwise, return as needed

## 2024-02-12 ENCOUNTER — Telehealth: Payer: Self-pay

## 2024-02-12 ENCOUNTER — Ambulatory Visit: Admitting: Urology

## 2024-02-12 DIAGNOSIS — N201 Calculus of ureter: Secondary | ICD-10-CM | POA: Diagnosis not present

## 2024-02-12 DIAGNOSIS — Z87442 Personal history of urinary calculi: Secondary | ICD-10-CM

## 2024-02-12 DIAGNOSIS — Z09 Encounter for follow-up examination after completed treatment for conditions other than malignant neoplasm: Secondary | ICD-10-CM

## 2024-02-12 LAB — URINALYSIS, ROUTINE W REFLEX MICROSCOPIC
Bilirubin, UA: NEGATIVE
Glucose, UA: NEGATIVE
Ketones, UA: NEGATIVE
Leukocytes,UA: NEGATIVE
Nitrite, UA: NEGATIVE
Specific Gravity, UA: 1.03 (ref 1.005–1.030)
Urobilinogen, Ur: 0.2 mg/dL (ref 0.2–1.0)
pH, UA: 6 (ref 5.0–7.5)

## 2024-02-12 LAB — MICROSCOPIC EXAMINATION
Bacteria, UA: NONE SEEN
WBC, UA: NONE SEEN /HPF (ref 0–5)

## 2024-02-12 NOTE — Telephone Encounter (Signed)
Ok to schedule.  Room : any   Thanks,  Vista Lawman, MD Gastroenterology and Hepatology [facility]

## 2024-02-12 NOTE — Telephone Encounter (Signed)
 This is actually a [address] patient, Dr, Shaaron did previous procedures. Sending to [facility] TRIAGE

## 2024-02-12 NOTE — Telephone Encounter (Signed)
   Who is your primary care physician: Dr.Fusco  Reasons for the colonoscopy: HX polyps  Have you had a colonoscopy before?  Yes 05/24/2018 Dr.Rourk  Do you have family history of colon cancer? no  Previous colonoscopy with polyps removed? yes  Do you have a history colorectal cancer?   no  Are you diabetic? If yes, Type 1 or Type 2?    Type 2  Do you have a prosthetic or mechanical heart valve? no  Do you have a pacemaker/defibrillator?   no  Have you had endocarditis/atrial fibrillation? no  Have you had joint replacement within the last 12 months?  no  Do you tend to be constipated or have to use laxatives? no  Do you have any history of drugs or alchohol?  no  Do you use supplemental oxygen?  no  Have you had a stroke or heart attack within the last 6 months? no  Do you take weight loss medication?  no    Do you take any blood-thinning medications such as: (aspirin, warfarin, Plavix, Aggrenox)  no  If yes we need the name, milligram, dosage and who is prescribing doctor  Current Outpatient Medications on File Prior to Visit  Medication Sig Dispense Refill   aspirin EC 81 MG tablet Take 81 mg by mouth daily.     EPINEPHrine  0.3 mg/0.3 mL IJ SOAJ injection See admin instructions.  0   HYDROcodone -acetaminophen  (NORCO/VICODIN) 5-325 MG tablet Take 1-2 tablets by mouth every 4 (four) hours as needed. (Patient not taking: Reported on 02/12/2024) 30 tablet 0   olmesartan (BENICAR) 40 MG tablet Take 40 mg by mouth daily.     No current facility-administered medications on file prior to visit.    Allergies  Allergen Reactions   Bee Venom Anaphylaxis    Yellow jackets     Pharmacy: [facility] Primary Insurance Name: CHARON [id number]  Best number where you can be reached: [phone]

## 2024-03-11 ENCOUNTER — Telehealth: Payer: Self-pay | Admitting: Internal Medicine

## 2024-03-11 NOTE — Telephone Encounter (Signed)
 He left a message that he hasn't heard from anyone since sending in his colonoscopy questionnaire.  [phone]

## 2024-03-12 NOTE — Telephone Encounter (Signed)
 Questionnaire received and sent to [employer] for review. They are behind right now on triages (currently in June). He will be called once addressed thanks!

## 2024-03-28 NOTE — Telephone Encounter (Signed)
 Ok to schedule  ASA 2

## 2024-03-28 NOTE — Telephone Encounter (Signed)
 Called pt. Received message # no longer in service. Tried x 2. Letter mailed.

## 2024-04-04 MED ORDER — PEG 3350-KCL-NA BICARB-NACL 420 G PO SOLR: 4000.0000 mL | Freq: Once | ORAL | 0 refills | Status: AC

## 2024-04-04 NOTE — Addendum Note (Signed)
 Addended by: JEANELL GRAEME RAMAN on: 04/04/2024 10:30 AM   Modules accepted: Orders

## 2024-04-04 NOTE — Telephone Encounter (Signed)
 Questionnaire from recall, no referral needed

## 2024-04-04 NOTE — Telephone Encounter (Signed)
 Pt called in. He has been scheduled for 9/29. Aware will mail instructions and send rx for prep to pharmacy

## 2024-04-09 DIAGNOSIS — N200 Calculus of kidney: Secondary | ICD-10-CM | POA: Diagnosis not present

## 2024-04-09 DIAGNOSIS — E1100 Type 2 diabetes mellitus with hyperosmolarity without nonketotic hyperglycemic-hyperosmolar coma (NKHHC): Secondary | ICD-10-CM | POA: Diagnosis not present

## 2024-04-09 DIAGNOSIS — I10 Essential (primary) hypertension: Secondary | ICD-10-CM | POA: Diagnosis not present

## 2024-04-09 DIAGNOSIS — Z6826 Body mass index (BMI) 26.0-26.9, adult: Secondary | ICD-10-CM | POA: Diagnosis not present

## 2024-05-12 ENCOUNTER — Ambulatory Visit (HOSPITAL_COMMUNITY): Admitting: Anesthesiology

## 2024-05-12 ENCOUNTER — Encounter (HOSPITAL_COMMUNITY)
Admission: RE | Disposition: A | Discharge status: Home / Self Care | Payer: Self-pay | Source: Home / Self Care | Attending: Internal Medicine

## 2024-05-12 ENCOUNTER — Ambulatory Visit (HOSPITAL_COMMUNITY)
Admission: RE | Admit: 2024-05-12 | Discharge: 2024-05-12 | Disposition: A | Discharge status: Home / Self Care | Attending: Internal Medicine | Admitting: Internal Medicine

## 2024-05-12 ENCOUNTER — Encounter (HOSPITAL_COMMUNITY): Payer: Self-pay | Admitting: Internal Medicine

## 2024-05-12 ENCOUNTER — Other Ambulatory Visit: Payer: Self-pay

## 2024-05-12 DIAGNOSIS — D1779 Benign lipomatous neoplasm of other sites: Secondary | ICD-10-CM | POA: Diagnosis not present

## 2024-05-12 DIAGNOSIS — D125 Benign neoplasm of sigmoid colon: Secondary | ICD-10-CM

## 2024-05-12 DIAGNOSIS — D123 Benign neoplasm of transverse colon: Secondary | ICD-10-CM

## 2024-05-12 DIAGNOSIS — Z1211 Encounter for screening for malignant neoplasm of colon: Secondary | ICD-10-CM

## 2024-05-12 DIAGNOSIS — I10 Essential (primary) hypertension: Secondary | ICD-10-CM | POA: Insufficient documentation

## 2024-05-12 DIAGNOSIS — D175 Benign lipomatous neoplasm of intra-abdominal organs: Secondary | ICD-10-CM

## 2024-05-12 DIAGNOSIS — R7303 Prediabetes: Secondary | ICD-10-CM

## 2024-05-12 DIAGNOSIS — Z860100 Personal history of colon polyps, unspecified: Secondary | ICD-10-CM

## 2024-05-12 HISTORY — PX: COLONOSCOPY: SHX5424

## 2024-05-12 SURGERY — COLONOSCOPY
Anesthesia: General

## 2024-05-12 MED ORDER — PROPOFOL 500 MG/50ML IV EMUL
INTRAVENOUS | Status: DC | PRN
  Administered 2024-05-12: 200 ug/kg/min via INTRAVENOUS

## 2024-05-12 MED ORDER — PROPOFOL 10 MG/ML IV BOLUS
INTRAVENOUS | Status: DC | PRN
  Administered 2024-05-12: 100 mg via INTRAVENOUS

## 2024-05-12 MED ORDER — LACTATED RINGERS IV SOLN: INTRAVENOUS | Status: DC

## 2024-05-12 NOTE — Anesthesia Preprocedure Evaluation (Addendum)
 Anesthesia Evaluation  Patient identified by MRN, date of birth, ID band Patient awake    Reviewed: Allergy & Precautions, H&P , NPO status , Patient's Chart, lab work & pertinent test results  Airway Mallampati: II  TM Distance: >3 FB Neck ROM: Full    Dental no notable dental hx. (+) Teeth Intact, Dental Advisory Given   Pulmonary neg pulmonary ROS   Pulmonary exam normal breath sounds clear to auscultation       Cardiovascular Exercise Tolerance: Good hypertension, Pt. on medications Normal cardiovascular exam Rhythm:Regular Rate:Normal     Neuro/Psych negative neurological ROS  negative psych ROS   GI/Hepatic negative GI ROS,,,Fatty liver   Endo/Other  Pre diabetes  Renal/GU negative Renal ROS  negative genitourinary   Musculoskeletal   Abdominal   Peds  Hematology negative hematology ROS (+)   Anesthesia Other Findings   Reproductive/Obstetrics negative OB ROS                              Anesthesia Physical Anesthesia Plan  ASA: 2  Anesthesia Plan: General   Post-op Pain Management: Minimal or no pain anticipated   Induction: Intravenous  PONV Risk Score and Plan: Propofol  infusion  Airway Management Planned: Nasal Cannula and Natural Airway  Additional Equipment: None  Intra-op Plan:   Post-operative Plan:   Informed Consent: I have reviewed the patients History and Physical, chart, labs and discussed the procedure including the risks, benefits and alternatives for the proposed anesthesia with the patient or authorized representative who has indicated his/her understanding and acceptance.     Dental advisory given  Plan Discussed with: CRNA  Anesthesia Plan Comments:          Anesthesia Quick Evaluation

## 2024-05-12 NOTE — H&P (Signed)
 @LOGO @   Gastroenterology Progress Note    Primary Care Physician:  Bertell Satterfield, MD Primary Gastroenterologist:  Dr. Shaaron  Pre-Procedure History & Physical: HPI:  [name] Alvarado is a 68 y.o. male here for  surveillance colonoscopy.  History of colonic polyps removed over time hyperplastic polyp removed in the rectum 2019.  Past Medical History:  Diagnosis Date   Diverticula of colon    Fatty liver    Hemorrhoids    History of kidney stones    passed stone   HSV infection    HTN (hypertension)    Hypercholesterolemia    no meds   Pre-diabetes    diet controlled   Tubular adenoma     Past Surgical History:  Procedure Laterality Date   COLONOSCOPY  09/10/2007   Dr. Shaaron- friable himorrhoids (internal and anal canal) o/w normal rectum,sigmoid diverticula, bx= tubular adenoma   COLONOSCOPY N/A 05/16/2013   Procedure: COLONOSCOPY;  Surgeon: Lamar CHRISTELLA Shaaron, MD;  [facility];  Service: Endoscopy;  Laterality: N/A;  7:30   COLONOSCOPY N/A 05/24/2018   Procedure: COLONOSCOPY;  Surgeon: Shaaron Lamar CHRISTELLA, MD;  [facility];  Service: Endoscopy;  Laterality: N/A;  12:00   HAND SURGERY Left    I & D EXTREMITY Left 05/03/2020   Procedure: Left Index finger irrigation and debridement, extensor tendon repair,  digital nerve repair and surgery as indicated;  Surgeon: Carolee Lynwood JINNY DOUGLAS, MD;  Location: [facility];  Service: Orthopedics;  Laterality: Left;    POLYPECTOMY  05/24/2018   Procedure: POLYPECTOMY;  Surgeon: Shaaron Lamar CHRISTELLA, MD;  [facility];  Service: Endoscopy;;   SHOULDER SURGERY     x 3 - right x 2, 1 on left   TONSILLECTOMY      Prior to Admission medications   Medication Sig Start Date End Date Taking? Authorizing Provider  glimepiride (AMARYL) 2 MG tablet Take 2 mg by mouth daily with breakfast.   Yes [provider]  olmesartan (BENICAR) 40 MG tablet Take 40 mg by mouth daily. 11/29/23  Yes [provider]   EPINEPHrine  0.3 mg/0.3 mL IJ SOAJ injection See admin instructions. 04/01/18   [provider]    Allergies as of 04/04/2024 - Review Complete 02/12/2024  Allergen Reaction Noted   Bee venom Anaphylaxis 05/20/2018    Family History  Problem Relation Age of Onset   Colon cancer Neg Hx     Social History   Socioeconomic History   Marital status: Divorced    Spouse name: Not on file   Number of children: Not on file   Years of education: Not on file   Highest education level: Not on file  Occupational History   Not on file  Tobacco Use   Smoking status: Never   Smokeless tobacco: Never  Vaping Use   Vaping status: Never Used  Substance and Sexual Activity   Alcohol use: No   Drug use: No   Sexual activity: Yes    Birth control/protection: None  Other Topics Concern   Not on file  Social History Narrative   Not on file   Social Drivers of Health   Financial Resource Strain: Not on file  Food Insecurity: Not on file  Transportation Needs: Not on file  Physical Activity: Not on file  Stress: Not on file  Social Connections: Not on file  Intimate Partner Violence: Not on file    Review of Systems   See HPI, otherwise  negative ROS  Physical Exam: BP (!) 172/92   Pulse 68   Temp 98.9 F (37.2 C) (Oral)   Resp 12   Ht 5' 11 (1.803 m)   Wt 83.9 kg   SpO2 98%   BMI 25.80 kg/m  General:   Alert,  Well-developed, well-nourished, pleasant and cooperative in NAD Neck:  Supple; no masses or thyromegaly. No significant cervical adenopathy. Lungs:  Clear throughout to auscultation.   No wheezes, crackles, or rhonchi. No acute distress. Heart:  Regular rate and rhythm; no murmurs, clicks, rubs,  or gallops. Abdomen: Non-distended, normal bowel sounds.  Soft and nontender without appreciable mass or hepatosplenomegaly.    Impression/Plan:     68 year old gentleman here for surveillance colonoscopy.  History of colon polyps. The risks, benefits,  limitations, alternatives and imponderables have been reviewed with the patient. Questions have been answered. All parties are agreeable.      Notice: This dictation was prepared with Dragon dictation along with smaller phrase technology. Any transcriptional errors that result from this process are unintentional and may not be corrected upon review.

## 2024-05-12 NOTE — Op Note (Signed)
 [facility] Patient Name: [name] Alvarado Procedure Date: 05/12/2024 12:41 PM MRN: [id number] Date of Birth: May 27, 1956 Attending MD: Lamar Ozell Hollingshead , MD, [id number] CSN: [id number] Age: 68 Admit Type: Outpatient Procedure:                Colonoscopy Indications:              High risk colon cancer surveillance: Personal                            history of colonic polyps Providers:                Lamar Ozell Hollingshead, MD, Harlene Lips, Daphne Mulch Technician, Technician Referring MD:              Medicines:                Propofol  per Anesthesia Complications:            No immediate complications. Estimated Blood Loss:     Estimated blood loss was minimal. Procedure:                Pre-Anesthesia Assessment:                           - Prior to the procedure, a History and Physical                            was performed, and patient medications and                            allergies were reviewed. The patient's tolerance of                            previous anesthesia was also reviewed. The risks                            and benefits of the procedure and the sedation                            options and risks were discussed with the patient.                            All questions were answered, and informed consent                            was obtained. Prior Anticoagulants: The patient has                            taken no anticoagulant or antiplatelet agents. ASA                            Grade Assessment: II - A patient with mild systemic  disease. After reviewing the risks and benefits,                            the patient was deemed in satisfactory condition to                            undergo the procedure.                           After obtaining informed consent, the colonoscope                            was passed under direct vision. Throughout the                             procedure, the patient's blood pressure, pulse, and                            oxygen saturations were monitored continuously. The                            [id number] ([id number]) Colon was introduced through                            the anus and advanced to the the cecum, identified                            by appendiceal orifice and ileocecal valve. The                            colonoscopy was performed without difficulty. The                            patient tolerated the procedure well. The quality                            of the bowel preparation was adequate. The                            ileocecal valve, appendiceal orifice, and rectum                            were photographed. Scope In: 1:04:48 PM Scope Out: 1:19:50 PM Scope Withdrawal Time: 0 hours 10 minutes 31 seconds  Total Procedure Duration: 0 hours 15 minutes 2 seconds  Findings:      The perianal and digital rectal examinations were normal.      Two sessile polyps were found in the sigmoid colon, transverse colon and       mid transverse colon. The polyps were 3 to 4 mm in size. These polyps       were removed with a cold snare. Resection and retrieval were complete.       Estimated blood loss was minimal. Estimated blood loss was minimal. 2 cm       submucosal nodule proximal transverse segment. Positive pillow sign.  The exam was otherwise without abnormality on direct and retroflexion       views. Impression:               - Two 3 to 4 mm polyps in the sigmoid colon, in the                            transverse colon and in the mid transverse colon,                            removed with a cold snare. Resected and retrieved.                            Colonic lipoma                           - The examination was otherwise normal on direct                            and retroflexion views. Moderate Sedation:      Moderate (conscious) sedation was personally administered by an       anesthesia  professional. The following parameters were monitored: oxygen       saturation, heart rate, blood pressure, respiratory rate, EKG, adequacy       of pulmonary ventilation, and response to care. Recommendation:           - Patient has a contact number available for                            emergencies. The signs and symptoms of potential                            delayed complications were discussed with the                            patient. Return to normal activities tomorrow.                            Written discharge instructions were provided to the                            patient.                           - Advance diet as tolerated. Follow-up on                            pathology. Further recommendations to follow. Procedure Code(s):        --- Professional ---                           [phone], Colonoscopy, flexible; with removal of                            tumor(s), polyp(s), or other lesion(s) by snare  technique Diagnosis Code(s):        --- Professional ---                           Z86.010, Personal history of colonic polyps                           D12.5, Benign neoplasm of sigmoid colon                           D12.3, Benign neoplasm of transverse colon (hepatic                            flexure or splenic flexure) CPT copyright 2022 American Medical Association. All rights reserved. The codes documented in this report are preliminary and upon coder review may  be revised to meet current compliance requirements. Lamar HERO. Kriste Broman, MD Lamar Ozell Hollingshead, MD 05/12/2024 1:25:58 PM This report has been signed electronically. Number of Addenda: 0

## 2024-05-12 NOTE — Anesthesia Postprocedure Evaluation (Signed)
 Anesthesia Post Note  Patient: [name] Alvarado  Procedure(s) Performed: COLONOSCOPY  Patient location during evaluation: Endoscopy Anesthesia Type: General Level of consciousness: awake and alert Pain management: pain level controlled Vital Signs Assessment: post-procedure vital signs reviewed and stable Respiratory status: spontaneous breathing, nonlabored ventilation and respiratory function stable Cardiovascular status: stable Anesthetic complications: no   There were no known notable events for this encounter.   Last Vitals:  Vitals:   05/12/24 1334 05/12/24 1338  BP: (!) 108/55 113/75  Pulse: 62   Resp:    Temp:    SpO2:      Last Pain:  Vitals:   05/12/24 1328  TempSrc:   PainSc: 0-No pain                 Colden Samaras L Rohini Jaroszewski

## 2024-05-12 NOTE — H&P (Signed)
 @[facility] @   Gastroenterology Progress Note    Primary Care Physician:  Bertell Satterfield, MD Primary Gastroenterologist:  Dr. Shaaron  Pre-Procedure History & Physical: HPI:  [name] Alvarado is a 68 y.o. male here for   Surveillance colonoscopy.  History of colonic adenomas removed previously.  Past Medical History:  Diagnosis Date   Diverticula of colon    Fatty liver    Hemorrhoids    History of kidney stones    passed stone   HSV infection    HTN (hypertension)    Hypercholesterolemia    no meds   Pre-diabetes    diet controlled   Tubular adenoma     Past Surgical History:  Procedure Laterality Date   COLONOSCOPY  09/10/2007   Dr. Shaaron- friable himorrhoids (internal and anal canal) o/w normal rectum,sigmoid diverticula, bx= tubular adenoma   COLONOSCOPY N/A 05/16/2013   Procedure: COLONOSCOPY;  Surgeon: Lamar CHRISTELLA Shaaron, MD;  Location: [facility];  Service: Endoscopy;  Laterality: N/A;  7:30   COLONOSCOPY N/A 05/24/2018   Procedure: COLONOSCOPY;  Surgeon: Shaaron Lamar CHRISTELLA, MD;  Location: [facility];  Service: Endoscopy;  Laterality: N/A;  12:00   HAND SURGERY Left    I & D EXTREMITY Left 05/03/2020   Procedure: Left Index finger irrigation and debridement, extensor tendon repair,  digital nerve repair and surgery as indicated;  Surgeon: Carolee Lynwood JINNY DOUGLAS, MD;  Location: [facility];  Service: Orthopedics;  Laterality: Left;    POLYPECTOMY  05/24/2018   Procedure: POLYPECTOMY;  Surgeon: Shaaron Lamar CHRISTELLA, MD;  Location: [facility];  Service: Endoscopy;;   SHOULDER SURGERY     x 3 - right x 2, 1 on left   TONSILLECTOMY      Prior to Admission medications   Medication Sig Start Date End Date Taking? Authorizing Provider  glimepiride (AMARYL) 2 MG tablet Take 2 mg by mouth daily with breakfast.   Yes [provider]  olmesartan (BENICAR) 40 MG tablet Take 40 mg by mouth daily. 11/29/23  Yes [provider]  EPINEPHrine  0.3 mg/0.3 mL IJ SOAJ injection  See admin instructions. 04/01/18   [provider]    Allergies as of 04/04/2024 - Review Complete 02/12/2024  Allergen Reaction Noted   Bee venom Anaphylaxis 05/20/2018    Family History  Problem Relation Age of Onset   Colon cancer Neg Hx     Social History   Socioeconomic History   Marital status: Divorced    Spouse name: Not on file   Number of children: Not on file   Years of education: Not on file   Highest education level: Not on file  Occupational History   Not on file  Tobacco Use   Smoking status: Never   Smokeless tobacco: Never  Vaping Use   Vaping status: Never Used  Substance and Sexual Activity   Alcohol use: No   Drug use: No   Sexual activity: Yes    Birth control/protection: None  Other Topics Concern   Not on file  Social History Narrative   Not on file   Social Drivers of Health   Financial Resource Strain: Not on file  Food Insecurity: Not on file  Transportation Needs: Not on file  Physical Activity: Not on file  Stress: Not on file  Social Connections: Not on file  Intimate Partner Violence: Not on file    Review of Systems   See HPI, otherwise negative ROS  Physical Exam: BP [name])  99/53   Pulse 60   Temp 97.9 F (36.6 C) (Oral)   Resp 15   Ht 5' 11 (1.803 m)   Wt 83.9 kg   SpO2 93%   BMI 25.80 kg/m  General:   Alert,  Well-developed, well-nourished, pleasant and cooperative in NAD Neck:  Supple; no masses or thyromegaly. No significant cervical adenopathy. Lungs:  Clear throughout to auscultation.   No wheezes, crackles, or rhonchi. No acute distress. Heart:  Regular rate and rhythm; no murmurs, clicks, rubs,  or gallops. Abdomen: Non-distended, normal bowel sounds.  Soft and nontender without appreciable mass or hepatosplenomegaly.    Impression/Plan:     68 year old gentleman with history of colonic adenoma.  Here for surveillance colonoscopy.  The risks, benefits, limitations, alternatives and imponderables  have been reviewed with the patient. Questions have been answered. All parties are agreeable.      Notice: This dictation was prepared with Dragon dictation along with smaller phrase technology. Any transcriptional errors that result from this process are unintentional and may not be corrected upon review.

## 2024-05-12 NOTE — Transfer of Care (Signed)
 Immediate Anesthesia Transfer of Care Note  Patient: [name] Alvarado  Procedure(s) Performed: COLONOSCOPY  Patient Location: Endoscopy Unit  Anesthesia Type:General  Level of Consciousness: awake, alert , oriented, and patient cooperative  Airway & Oxygen Therapy: Patient Spontanous Breathing  Post-op Assessment: Report given to RN, Post -op Vital signs reviewed and stable, and Patient moving all extremities X 4  Post vital signs: Reviewed and stable  Last Vitals:  Vitals Value Taken Time  BP 99/53 05/12/24 13:24  Temp 36.6 C 05/12/24 13:24  Pulse 60 05/12/24 13:24  Resp 15 05/12/24 13:24  SpO2 93 % 05/12/24 13:24    Last Pain:  Vitals:   05/12/24 1324  TempSrc: Oral  PainSc:       Patients Stated Pain Goal: 4 (05/12/24 1158)  Complications: No notable events documented.

## 2024-05-12 NOTE — Discharge Instructions (Signed)
  Colonoscopy Discharge Instructions  Read the instructions outlined below and refer to this sheet in the next few weeks. These discharge instructions provide you with general information on caring for yourself after you leave the hospital. Your doctor may also give you specific instructions. While your treatment has been planned according to the most current medical practices available, unavoidable complications occasionally occur. If you have any problems or questions after discharge, call Dr. Shaaron at [phone]. ACTIVITY You may resume your regular activity, but move at a slower pace for the next 24 hours.  Take frequent rest periods for the next 24 hours.  Walking will help get rid of the air and reduce the bloated feeling in your belly (abdomen).  No driving for 24 hours (because of the medicine (anesthesia) used during the test).   Do not sign any important legal documents or operate any machinery for 24 hours (because of the anesthesia used during the test).  NUTRITION Drink plenty of fluids.  You may resume your normal diet as instructed by your doctor.  Begin with a light meal and progress to your normal diet. Heavy or fried foods are harder to digest and may make you feel sick to your stomach (nauseated).  Avoid alcoholic beverages for 24 hours or as instructed.  MEDICATIONS You may resume your normal medications unless your doctor tells you otherwise.  WHAT YOU CAN EXPECT TODAY Some feelings of bloating in the abdomen.  Passage of more gas than usual.  Spotting of blood in your stool or on the toilet paper.  IF YOU HAD POLYPS REMOVED DURING THE COLONOSCOPY: No aspirin products for 7 days or as instructed.  No alcohol for 7 days or as instructed.  Eat a soft diet for the next 24 hours.  FINDING OUT THE RESULTS OF YOUR TEST Not all test results are available during your visit. If your test results are not back during the visit, make an appointment with your caregiver to find out the  results. Do not assume everything is normal if you have not heard from your caregiver or the medical facility. It is important for you to follow up on all of your test results.  SEEK IMMEDIATE MEDICAL ATTENTION IF: You have more than a spotting of blood in your stool.  Your belly is swollen (abdominal distention).  You are nauseated or vomiting.  You have a temperature over 101.  You have abdominal pain or discomfort that is severe or gets worse throughout the day.      2 polyps found and removed today  Colon polyp and diverticulosis information provided  further recommendations to follow pending review of pathology report

## 2024-05-13 ENCOUNTER — Encounter (HOSPITAL_COMMUNITY): Payer: Self-pay | Admitting: Internal Medicine

## 2024-05-13 LAB — SURGICAL PATHOLOGY

## 2024-05-15 ENCOUNTER — Ambulatory Visit: Payer: Self-pay | Admitting: Internal Medicine

## 2024-07-22 ENCOUNTER — Ambulatory Visit: Admitting: Urology
